# Patient Record
Sex: Female | Born: 1980
Health system: Southern US, Community
[De-identification: ages and names within clinical notes are randomized; demographics above are authoritative.]

## PROBLEM LIST (undated history)

## (undated) DIAGNOSIS — G43909 Migraine, unspecified, not intractable, without status migrainosus: Secondary | ICD-10-CM

## (undated) HISTORY — PX: APPENDECTOMY: SHX54

## (undated) HISTORY — PX: TONSILLECTOMY: SUR1361

## (undated) HISTORY — DX: Migraine, unspecified, not intractable, without status migrainosus: G43.909

## (undated) HISTORY — PX: ELBOW ARTHROSCOPY: SUR87

---

## 2011-07-01 DIAGNOSIS — G43909 Migraine, unspecified, not intractable, without status migrainosus: Secondary | ICD-10-CM | POA: Insufficient documentation

## 2012-06-30 DIAGNOSIS — M79673 Pain in unspecified foot: Secondary | ICD-10-CM | POA: Insufficient documentation

## 2012-06-30 DIAGNOSIS — R609 Edema, unspecified: Secondary | ICD-10-CM | POA: Insufficient documentation

## 2012-06-30 DIAGNOSIS — M79672 Pain in left foot: Secondary | ICD-10-CM | POA: Insufficient documentation

## 2013-10-13 DIAGNOSIS — S42409A Unspecified fracture of lower end of unspecified humerus, initial encounter for closed fracture: Secondary | ICD-10-CM | POA: Insufficient documentation

## 2013-10-13 DIAGNOSIS — G43709 Chronic migraine without aura, not intractable, without status migrainosus: Secondary | ICD-10-CM | POA: Insufficient documentation

## 2013-10-13 DIAGNOSIS — R1030 Lower abdominal pain, unspecified: Secondary | ICD-10-CM | POA: Insufficient documentation

## 2013-10-13 DIAGNOSIS — N3941 Urge incontinence: Secondary | ICD-10-CM | POA: Insufficient documentation

## 2013-10-13 DIAGNOSIS — M545 Low back pain, unspecified: Secondary | ICD-10-CM | POA: Insufficient documentation

## 2013-10-13 DIAGNOSIS — F988 Other specified behavioral and emotional disorders with onset usually occurring in childhood and adolescence: Secondary | ICD-10-CM | POA: Insufficient documentation

## 2013-10-13 DIAGNOSIS — N3010 Interstitial cystitis (chronic) without hematuria: Secondary | ICD-10-CM | POA: Insufficient documentation

## 2013-10-13 DIAGNOSIS — R3911 Hesitancy of micturition: Secondary | ICD-10-CM | POA: Insufficient documentation

## 2013-10-13 DIAGNOSIS — R350 Frequency of micturition: Secondary | ICD-10-CM | POA: Insufficient documentation

## 2015-06-28 DIAGNOSIS — N200 Calculus of kidney: Secondary | ICD-10-CM | POA: Insufficient documentation

## 2015-06-28 DIAGNOSIS — N390 Urinary tract infection, site not specified: Secondary | ICD-10-CM | POA: Insufficient documentation

## 2015-06-28 DIAGNOSIS — R87620 Atypical squamous cells of undetermined significance on cytologic smear of vagina (ASC-US): Secondary | ICD-10-CM | POA: Insufficient documentation

## 2015-06-28 DIAGNOSIS — B001 Herpesviral vesicular dermatitis: Secondary | ICD-10-CM | POA: Insufficient documentation

## 2015-06-28 DIAGNOSIS — E669 Obesity, unspecified: Secondary | ICD-10-CM | POA: Insufficient documentation

## 2015-06-28 DIAGNOSIS — Z975 Presence of (intrauterine) contraceptive device: Secondary | ICD-10-CM | POA: Insufficient documentation

## 2015-06-28 DIAGNOSIS — N926 Irregular menstruation, unspecified: Secondary | ICD-10-CM | POA: Insufficient documentation

## 2015-06-28 DIAGNOSIS — F411 Generalized anxiety disorder: Secondary | ICD-10-CM | POA: Insufficient documentation

## 2015-06-28 DIAGNOSIS — G44219 Episodic tension-type headache, not intractable: Secondary | ICD-10-CM | POA: Insufficient documentation

## 2015-06-28 DIAGNOSIS — G4700 Insomnia, unspecified: Secondary | ICD-10-CM | POA: Insufficient documentation

## 2018-12-08 ENCOUNTER — Other Ambulatory Visit: Payer: Self-pay

## 2018-12-08 ENCOUNTER — Emergency Department (INDEPENDENT_AMBULATORY_CARE_PROVIDER_SITE_OTHER): Payer: No Typology Code available for payment source

## 2018-12-08 ENCOUNTER — Emergency Department
Admission: EM | Admit: 2018-12-08 | Discharge: 2018-12-08 | Disposition: A | Discharge status: Home / Self Care | Payer: No Typology Code available for payment source | Source: Home / Self Care | Attending: Family Medicine | Admitting: Family Medicine

## 2018-12-08 DIAGNOSIS — M5412 Radiculopathy, cervical region: Secondary | ICD-10-CM | POA: Diagnosis not present

## 2018-12-08 DIAGNOSIS — M542 Cervicalgia: Secondary | ICD-10-CM | POA: Diagnosis not present

## 2018-12-08 MED ORDER — PREDNISONE 20 MG PO TABS: ORAL_TABLET | ORAL | 0 refills | Status: DC

## 2018-12-08 MED ORDER — KETOROLAC TROMETHAMINE 60 MG/2ML IM SOLN
60.0000 mg | Freq: Once | INTRAMUSCULAR | Status: AC
  Administered 2018-12-08: 60 mg via INTRAMUSCULAR

## 2018-12-08 MED ORDER — TRAMADOL HCL 50 MG PO TABS: 50.0000 mg | ORAL_TABLET | Freq: Four times a day (QID) | ORAL | 0 refills | Status: DC | PRN

## 2018-12-08 NOTE — Discharge Instructions (Signed)
Apply ice pack for 20 to 30 minutes, 3 to 4 times daily  Continue until pain decreases.  °

## 2018-12-08 NOTE — ED Triage Notes (Signed)
As long as patient can remember whenever moving a patient around, she has neck pain that turns into a migraine.  This time it is more severe than other times.  Intense severe pain behind left ear.

## 2018-12-08 NOTE — ED Provider Notes (Signed)
[facility]    CSN: [id number] Arrival date & time: 12/08/18  1417     History   Chief Complaint Chief Complaint  Patient presents with  . Neck Pain    HPI [facility] is a 38 y.o. female.   Patient complains of approximately four year history of recurring left neck pain and headache, at least 3 to 4 times per week.  The pain often precipitates a migraine headache.  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  Last week her pain became worse and more frequent than in the past.  She has had significant pain behind and beneath her left ear which awakens her at night.  She recalls no recent injury.  The history is provided by the patient.    History reviewed. Past medical history of ADHD and interstitial cystiitis  Active problems:  None   Past Surgical History:  Procedure Laterality Date  . APPENDECTOMY    . ELBOW ARTHROSCOPY    . TONSILLECTOMY      OB History   G2P2      Home Medications    Prior to Admission medications   Medication Sig Start Date End Date Taking? Authorizing Provider  predniSONE (DELTASONE) 20 MG tablet Take one tab by mouth twice daily for 4 days, then one daily. Take with food. 12/08/18   Lattie HawBeese, Arlita Buffkin A, MD  traMADol (ULTRAM) 50 MG tablet Take 1 tablet (50 mg total) by mouth every 6 (six) hours as needed for moderate pain or severe pain. 12/08/18   Lattie HawBeese, Sunya Humbarger A, MD    Family History History reviewed. No pertinent family history.  Social History Social History   Tobacco Use  . Smoking status: Never Smoker  . Smokeless tobacco: Never Used  Substance Use Topics  . Alcohol use: Not Currently  . Drug use: Not Currently     Allergies   Patient has no allergy information on record.   Review of Systems Review of Systems  Constitutional: Negative.   HENT: Negative.   Eyes: Negative.   Respiratory: Negative.   Cardiovascular: Negative.    Gastrointestinal: Negative.   Genitourinary: Negative.   Musculoskeletal: Positive for neck pain and neck stiffness.  Skin: Negative.   Neurological: Positive for headaches. Negative for numbness.     Physical Exam Triage Vital Signs ED Triage Vitals  Enc Vitals Group     BP 12/08/18 1446 126/81     Pulse Rate 12/08/18 1446 75     Resp 12/08/18 1446 20     Temp 12/08/18 1446 98.5 F (36.9 C)     Temp Source 12/08/18 1446 Oral     SpO2 12/08/18 1446 98 %     Weight 12/08/18 1447 198 lb (89.8 kg)     Height 12/08/18 1447 5\' 4"  (1.626 m)     Head Circumference --      Peak Flow --      Pain Score 12/08/18 1446 8     Pain Loc --      Pain Edu? --      Excl. in GC? --    No data found.  Updated Vital Signs BP 126/81 (BP Location: Right Arm)   Pulse 75   Temp 98.5 F (36.9 C) (Oral)   Resp 20   Ht 5\' 4"  (1.626 m)   Wt 89.8 kg   LMP 11/24/2018   SpO2 98%   BMI 33.99 kg/m  Visual Acuity Right Eye Distance:   Left Eye Distance:   Bilateral Distance:    Right Eye Near:   Left Eye Near:    Bilateral Near:     Physical Exam Vitals signs and nursing note reviewed.  Constitutional:      General: She is not in acute distress. HENT:     Head: Normocephalic.     Right Ear: Tympanic membrane, ear canal and external ear normal.     Left Ear: Tympanic membrane, ear canal and external ear normal.     Nose: Nose normal.     Mouth/Throat:     Pharynx: Oropharynx is clear.  Eyes:     Conjunctiva/sclera: Conjunctivae normal.     Pupils: Pupils are equal, round, and reactive to light.  Neck:     Musculoskeletal: Neck supple. Muscular tenderness present.      Comments: Patient has distinct tenderness to palpation left neck and occipital area as noted on diagram.  Her pain extends below this area without tenderness to palpation.   Her pain is exacerbated by rotating and flexing her head to the right. Cardiovascular:     Heart sounds: Normal heart sounds.  Pulmonary:      Breath sounds: Normal breath sounds.  Abdominal:     Tenderness: There is no abdominal tenderness.  Lymphadenopathy:     Cervical: No cervical adenopathy.  Skin:    General: Skin is warm and dry.     Findings: No rash.  Neurological:     Mental Status: She is alert.     Cranial Nerves: Cranial nerves are intact.     Sensory: No sensory deficit.     Motor: Motor function is intact.     Coordination: Romberg sign negative. Coordination normal. Finger-Nose-Finger Test normal.     Deep Tendon Reflexes: Reflexes are normal and symmetric.      UC Treatments / Results  Labs (all labs ordered are listed, but only abnormal results are displayed) Labs Reviewed - No data to display  EKG   Radiology Dg Cervical Spine Complete  Result Date: 12/08/2018 CLINICAL DATA:  Cervicalgia EXAM: CERVICAL SPINE - COMPLETE 4+ VIEW COMPARISON:  None. FINDINGS: Frontal, lateral, open-mouth odontoid, and bilateral oblique views were obtained. There is no fracture or spondylolisthesis. Prevertebral soft tissues and predental space regions are normal. There is moderate disc space narrowing at C6-7. Other disc spaces appear unremarkable. There is exit foraminal narrowing on the left at C6-7 due to bony hypertrophy. No similar changes elsewhere. Lung apices clear. IMPRESSION: Osteoarthritic change at C6-7, primarily on the left. Other disc spaces appear unremarkable. No fracture or spondylolisthesis. Electronically Signed   By: Bretta BangWilliam  Woodruff III M.D.   On: 12/08/2018 15:56    Procedures Procedures (including critical care time)  Medications Ordered in UC Medications  ketorolac (TORADOL) injection 60 mg (60 mg Intramuscular Given 12/08/18 1540)    Initial Impression / Assessment and Plan / UC Course  I have reviewed the triage vital signs and the nursing notes.  Pertinent labs & imaging results that were available during my care of the patient were reviewed by me and considered in my medical decision  making (see chart for details).    Administered Toradol 60mg  IM with mild improvement in pain. C-spine X-ray definitely shows foraminal narrowing on the left at C6-C7 where patient experiences pain, but she also has distinct pain and tenderness at higher levels on the left. Begin prednisone burst/taper.  Rx for Tramadol (#15, no refill). Followup  with Dr. Lynne Leader ([facility]) for further evaluation.  Final Clinical Impressions(s) / UC Diagnoses   Final diagnoses:  Cervical radiculopathy     Discharge Instructions     Apply ice pack for 20 to 30 minutes, 3 to 4 times daily  Continue until pain decreases    ED Prescriptions    Medication Sig Dispense Auth. Provider   predniSONE (DELTASONE) 20 MG tablet Take one tab by mouth twice daily for 4 days, then one daily. Take with food. 12 tablet Kandra Nicolas, MD   traMADol (ULTRAM) 50 MG tablet Take 1 tablet (50 mg total) by mouth every 6 (six) hours as needed for moderate pain or severe pain. 15 tablet Kandra Nicolas, MD        Kandra Nicolas, MD 12/09/18 1213

## 2018-12-10 ENCOUNTER — Ambulatory Visit (INDEPENDENT_AMBULATORY_CARE_PROVIDER_SITE_OTHER): Payer: No Typology Code available for payment source | Admitting: Family Medicine

## 2018-12-10 ENCOUNTER — Encounter: Payer: Self-pay | Admitting: Family Medicine

## 2018-12-10 ENCOUNTER — Other Ambulatory Visit: Payer: Self-pay

## 2018-12-10 VITALS — BP 119/76 | HR 72 | Temp 98.2°F | Ht 65.0 in | Wt 194.9 lb

## 2018-12-10 DIAGNOSIS — M62838 Other muscle spasm: Secondary | ICD-10-CM | POA: Diagnosis not present

## 2018-12-10 MED ORDER — TIZANIDINE HCL 4 MG PO CAPS: 4.0000 mg | ORAL_CAPSULE | Freq: Three times a day (TID) | ORAL | 3 refills | Status: DC | PRN

## 2018-12-10 NOTE — Progress Notes (Signed)
Erin [facility] is a 38 y.o. female who presents to [place] today for left sided neck pain.  Neck Pain: Patient reports a several year Hx of neck pain on the left side. Says it has been getting worse in the last year. Describes severe pain over the left neck just inferior to the mastoid process. Sometimes has pain moving down left arm. Denies numbness, weakness, or paresthesias She also have accompanying tension headaches. Patient visited Urgent Care on 12/08/2018 for a severe flare up of this pain and was prescribed Prednisone and Tramadol, which has been helping but not relieving the pain. Pain is improved with Tizanidine, Excedrin, NSAIDs, but she not taken Tizanidine in a while. 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 She denies any current pain radiating down her arm.  She has had some pain similar to that in the past but none with this current episode.  She works as a Archivist requiring lots of lifting.   ROS:  As above  Exam:  BP 119/76   Pulse 72   Temp 98.2 F (36.8 C) (Oral)   Ht 5\' 5"  (1.651 m)   Wt 194 lb 14.4 oz (88.4 kg)   LMP 11/24/2018   BMI 32.43 kg/m  Wt Readings from Last 5 Encounters:  12/10/18 194 lb 14.4 oz (88.4 kg)  12/08/18 198 lb (89.8 kg)   General: Well Developed, well nourished, and in no acute distress.  Neuro/Psych: Alert and oriented x3, extra-ocular muscles intact, able to move all 4 extremities, sensation grossly intact. Skin: Warm and dry, no rashes noted.  Respiratory: Not using accessory muscles, speaking in full sentences, trachea midline.  Cardiovascular: Pulses palpable, no extremity edema. Abdomen: Does not appear distended. MSK: CSpine:  Normal Appearing. Tender to palpation over left lateral neck inferior to mastoid process. Stiffness and pain with neck forward flexion and extension, lateral flexion. Marland Kitchen  Upper extremity strength reflexes and sensation  are intact and equal throughout.  Negative Spurling's test.  Left Arm: Normal appearing. Non-tender to palpation. Normal strength BL. Normal grip strength. Reflexes 2+ and symmetrical.    Lab and Radiology Results No results found for this or any previous visit (from the past 72 hour(s)). Dg Cervical Spine Complete  Result Date: 12/08/2018 CLINICAL DATA:  Cervicalgia EXAM: CERVICAL SPINE - COMPLETE 4+ VIEW COMPARISON:  None. FINDINGS: Frontal, lateral, open-mouth odontoid, and bilateral oblique views were obtained. There is no fracture or spondylolisthesis. Prevertebral soft tissues and predental space regions are normal. There is moderate disc space narrowing at C6-7. Other disc spaces appear unremarkable. There is exit foraminal narrowing on the left at C6-7 due to bony hypertrophy. No similar changes elsewhere. Lung apices clear. IMPRESSION: Osteoarthritic change at C6-7, primarily on the left. Other disc spaces appear unremarkable. No fracture or spondylolisthesis. Electronically Signed   By: Lowella Grip III M.D.   On: 12/08/2018 15:56   I personally (independently) visualized and performed the interpretation of the images attached in this note.     Assessment and Plan: 38 y.o. female with several year Hx of neck pain on the left side that has been worsening.  Neck Pain: Describes severe pain over the left neck just inferior to the mastoid process. Sometimes has pain moving down left arm. Denies numbness, weakness, or paresthesias She also have accompanying tension headaches. Patient visited Urgent Care on 12/08/2018 for a severe flare up of this pain and was prescribed Prednisone  and Tramadol, which has been helping but not relieving the pain. Pain is improved with Tizanidine, Excedrin, NSAIDs. 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 X-ray showed mild cervical spine OA and straightening of cervical vertebral curve. Location of pain on upper  neck and lack of consistent symptoms radiating down arm suggests MSK etiology such as muscle overuse and spasms instead of nerve impingement.   Refered to PT. Recommended use of heating pad and TENS unit. Prescribed Tizanidine.  Follow-up if not improving in 4 weeks.  Come back or go to the emergency room if you notice new weakness new numbness, problems walking, or bowel or bladder incontinence.   PDMP not reviewed this encounter. Orders Placed This Encounter  Procedures  . Ambulatory referral to Physical Therapy    Referral Priority:   Routine    Referral Type:   Physical Medicine    Referral Reason:   Specialty Services Required    Requested Specialty:   Physical Therapy   Meds ordered this encounter  Medications  . tiZANidine (ZANAFLEX) 4 MG capsule    Sig: Take 1 capsule (4 mg total) by mouth 3 (three) times daily as needed for muscle spasms.    Dispense:  90 capsule    Refill:  3    Historical information moved to improve visibility of documentation.  No past medical history on file. Past Surgical History:  Procedure Laterality Date  . APPENDECTOMY    . ELBOW ARTHROSCOPY    . TONSILLECTOMY     Social History   Tobacco Use  . Smoking status: Never Smoker  . Smokeless tobacco: Never Used  Substance Use Topics  . Alcohol use: Not Currently   family history is not on file.  Medications: Current Outpatient Medications  Medication Sig Dispense Refill  . predniSONE (DELTASONE) 20 MG tablet Take one tab by mouth twice daily for 4 days, then one daily. Take with food. 12 tablet 0  . traMADol (ULTRAM) 50 MG tablet Take 1 tablet (50 mg total) by mouth every 6 (six) hours as needed for moderate pain or severe pain. 15 tablet 0  . tiZANidine (ZANAFLEX) 4 MG capsule Take 1 capsule (4 mg total) by mouth 3 (three) times daily as needed for muscle spasms. 90 capsule 3   No current facility-administered medications for this visit.    Allergies  Allergen Reactions  . Latex  Itching and Rash    Other reaction(s): Flushing (ALLERGY/intolerance)  . Sulfamethoxazole-Trimethoprim Hives and Itching  . Cephalexin Nausea And Vomiting, Itching and Rash    Hives/vomiting   . Nitrofurantoin Nausea And Vomiting, Itching and Rash    Hives/vomiting       Discussed warning signs or symptoms. Please see discharge instructions. Patient expresses understanding.  I personally was present and performed or re-performed the history, physical exam and medical decision-making activities of this service and have verified that the service and findings are accurately documented in the student's note. ___________________________________________ Clementeen GrahamEvan Ryder Chesmore M.D., ABFM., CAQSM. Primary Care and Sports Medicine Adjunct Instructor of Family Medicine  [employer]

## 2018-12-10 NOTE — Patient Instructions (Addendum)
Thank you for coming in today.  Use a heating pad and TENS unit.  Attend PT.  Recheck if not improving a lot in 4 weeks.  Come back or go to the emergency room if you notice new weakness new numbness problems walking or bowel or bladder problems.  TENS UNIT: This is helpful for muscle pain and spasm.   Search and Purchase a TENS 7000 2nd edition at  www.tenspros.com or www.[email or web address].com It should be less than $30.     TENS unit instructions: Do not shower or bathe with the unit on Turn the unit off before removing electrodes or batteries If the electrodes lose stickiness add a drop of water to the electrodes after they are disconnected from the unit and place on plastic sheet. If you continued to have difficulty, call the TENS unit company to purchase more electrodes. Do not apply lotion on the skin area prior to use. Make sure the skin is clean and dry as this will help prolong the life of the electrodes. After use, always check skin for unusual red areas, rash or other skin difficulties. If there are any skin problems, does not apply electrodes to the same area. Never remove the electrodes from the unit by pulling the wires. Do not use the TENS unit or electrodes other than as directed. Do not change electrode placement without consultating your therapist or physician. Keep 2 fingers with between each electrode. Wear time ratio is 2:1, on to off times.    For example on for 30 minutes off for 15 minutes and then on for 30 minutes off for 15 minutes

## 2019-01-11 ENCOUNTER — Ambulatory Visit (INDEPENDENT_AMBULATORY_CARE_PROVIDER_SITE_OTHER): Payer: No Typology Code available for payment source | Admitting: Rehabilitative and Restorative Service Providers"

## 2019-01-11 ENCOUNTER — Other Ambulatory Visit: Payer: Self-pay

## 2019-01-11 DIAGNOSIS — R293 Abnormal posture: Secondary | ICD-10-CM

## 2019-01-11 DIAGNOSIS — M542 Cervicalgia: Secondary | ICD-10-CM

## 2019-01-11 DIAGNOSIS — R29898 Other symptoms and signs involving the musculoskeletal system: Secondary | ICD-10-CM | POA: Diagnosis not present

## 2019-01-11 NOTE — Therapy (Signed)
[facility] [place], [place], [address] Phone: [phone]   Fax:  [phone]  Physical Therapy Evaluation  Patient Details  Name: [facility] MRN: [id number] Date of Birth: May 21, 1980 Referring Provider (PT): Dr Lynne Leader    Encounter Date: 01/11/2019  PT End of Session - 01/11/19 1008    Visit Number  1    Number of Visits  12    Date for PT Re-Evaluation  02/22/19    PT Start Time  0821    PT Stop Time  0903    PT Time Calculation (min)  42 min    Activity Tolerance  Patient tolerated treatment well       No past medical history on file.  Past Surgical History:  Procedure Laterality Date  . APPENDECTOMY    . ELBOW ARTHROSCOPY    . TONSILLECTOMY      There were no vitals filed for this visit.   Subjective Assessment - 01/11/19 1324    Subjective  Patient reports neck pain in the past 8-10 years with pain increased in the past 3-4 weeks. She has episodic pain related to lifting and wakr tasks    Pertinent History  elbow surgery; arthritis    Patient Stated Goals  get rid of pain    Currently in Pain?  Yes    Pain Score  7     Pain Location  Neck    Pain Orientation  Left    Pain Descriptors / Indicators  Aching;Dull;Throbbing    Pain Type  Acute pain;Chronic pain    Pain Radiating Towards  radiating down arm to forearms and hand - a warm sensation    Pain Onset  More than a month ago    Pain Frequency  Constant    Aggravating Factors   lifting; pushing; pulling; use of Lt UE    Pain Relieving Factors  medicine on a temporary basis         [facility] PT Assessment - 01/11/19 0001      Assessment   Medical Diagnosis  Cervicalgia    Referring Provider (PT)  Dr Lynne Leader     Onset Date/Surgical Date  12/07/18    Hand Dominance  Right    Next MD Visit  none scheduled     Prior Therapy  none per pt report       Precautions   Precautions  None      Balance Screen   Has the patient  fallen in the past 6 months  No    Has the patient had a decrease in activity level because of a fear of falling?   No    Is the patient reluctant to leave their home because of a fear of falling?   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     Leisure  2 children at home single mom; household chores; child care       Observation/Other Assessments   Focus on Therapeutic Outcomes (FOTO)   50% limitation       Sensation   Additional Comments  warm sensation into the dorsal forearm and hand on an intermittent basis       Posture/Postural Control   Posture Comments  head forward; shoudlers rounded and elevated; head of  the humerus anterior in orientation; scapulae abducted and rotated along the thoracic spine       AROM   AROM Assessment Site  --   pain with all cervical motions > flexion; Lt lat flex   Cervical Flexion  47    Cervical Extension  33    Cervical - Right Side Bend  36    Cervical - Left Side Bend  23    Cervical - Right Rotation  63    Cervical - Left Rotation  70      Strength   Overall Strength Comments  WFL's        Palpation   Palpation comment  muscular tightness Lt upper quarter - ant/lat/post cervical musculature; pecs; upper trap; leveator; teres                 Objective measurements completed on examination: See above findings.      [facility] Adult PT Treatment/Exercise - 01/11/19 0001      Neuro Re-ed    Neuro Re-ed Details   initiated postural correction/education       Shoulder Exercises: Standing   Other Standing Exercises  axial extension 10 sec x 10; scap squeeze 10 sec x 5; L's x 5 with noodle       Moist Heat Therapy   Number Minutes Moist Heat  15 Minutes    Moist Heat Location  Cervical;Shoulder   Lt shoulder      Electrical Stimulation   Electrical Stimulation Location  Lt cervical to Lt upper  trap/pecs     Electrical Stimulation Action  IFC    Electrical Stimulation Parameters  to tolerance    Electrical Stimulation Goals  Pain;Tone             PT Education - 01/11/19 1006    Education Details  HEP; postural correction; musculoskeletal pain; TENS; POC    Person(s) Educated  Patient    Methods  Explanation;Demonstration;Tactile cues;Verbal cues;Handout    Comprehension  Verbalized understanding;Returned demonstration;Verbal cues required;Tactile cues required          PT Long Term Goals - 01/11/19 1014      PT LONG TERM GOAL #1   Title  Decrease cervical and UE pain/symptoms by 50-70% allowing patient to progress with stretching, strengthening, stabilization program and participate in work and home activities with greater ease    Time  6    Period  Weeks    Status  New    Target Date  02/22/19      PT LONG TERM GOAL #2   Title  Increase cervical ROM to WFL's and pain free in all planes    Time  6    Period  Weeks    Status  New    Target Date  02/22/19      PT LONG TERM GOAL #3   Title  Improve cervical and thoacic posture and alignment with patient to demonstrate improved upright posture with head in good alignment and posterior shoulder girdle engaged    Time  6    Period  Weeks    Status  New    Target Date  02/22/19      PT LONG TERM GOAL #4   Title  Independent in HEP    Time  6    Period  Weeks    Status  New    Target Date  02/22/19      PT LONG TERM GOAL #5   Title  Improve  FOTO to </= 33% limitation    Time  6    Period  Weeks    Status  New    Target Date  02/22/19             Plan - 01/11/19 1009    Clinical Impression Statement  Patient presents with recent flare up of chronic cervical dysfunction. She has poor posture and alignment; limited cervical and shoulder ROM; significant muscular tightness and imbalance through the cervical and shoulder girdle musculature; pain on a constant basis; limited functional and work  activity tolerance. Patient will benefit form PT to address problems identified.    Stability/Clinical Decision Making  Stable/Uncomplicated    Clinical Decision Making  Low    Rehab Potential  Good    PT Frequency  2x / week    PT Duration  6 weeks    PT Treatment/Interventions  Patient/family education;ADLs/Self Care Home Management;Cryotherapy;Electrical Stimulation;Iontophoresis 4mg /ml Dexamethasone;Moist Heat;Ultrasound;Manual techniques;Dry needling;Traction;Neuromuscular re-education;Therapeutic activities;Therapeutic exercise    PT Next Visit Plan  review HEP; add pec stretch; work on posture and alignment stretching pecs and strengthening posterior shoulder girdle as symptoms allow; add manual work focus on anterior structures; possible trial of DN; modalities as indicated    PT Home Exercise Plan  [email or web address]    Consulted and Agree with Plan of Care  Patient       Patient will benefit from skilled therapeutic intervention in order to improve the following deficits and impairments:  Pain, Increased fascial restricitons, Increased muscle spasms, Hypomobility, Postural dysfunction, Improper body mechanics, Decreased mobility, Decreased range of motion, Decreased activity tolerance  Visit Diagnosis: Cervicalgia - Plan: PT plan of care cert/re-cert  Abnormal posture - Plan: PT plan of care cert/re-cert  Other symptoms and signs involving the musculoskeletal system - Plan: PT plan of care cert/re-cert     Problem List There are no active problems to display for this patient.   Ethridge Sollenberger Rober Minion PT, MPH  01/11/2019, 1:01 PM  [facility] [address] [place], [place], [address] Phone: [phone]   Fax:  [phone]  Name: Erin [facility] MRN: [id number] Date of Birth: March 31, 1981

## 2019-01-11 NOTE — Patient Instructions (Signed)
Access Code: [email or web address]  URL: [email or web address]  Date: 01/11/2019  Prepared by: Gillermo Murdoch   Exercises  Seated Cervical Retraction - 10 reps - 1 sets - 3x daily - 7x weekly  Standing Scapular Retraction - 10 reps - 1 sets - 10 hold - 3x daily - 7x weekly  Shoulder External Rotation and Scapular Retraction - 10 reps - 1 sets - hold - 3x daily - 7x weekly  Patient Education  TENS Unit

## 2019-01-15 ENCOUNTER — Other Ambulatory Visit: Payer: Self-pay

## 2019-01-15 ENCOUNTER — Ambulatory Visit (INDEPENDENT_AMBULATORY_CARE_PROVIDER_SITE_OTHER): Payer: No Typology Code available for payment source | Admitting: Rehabilitative and Restorative Service Providers"

## 2019-01-15 ENCOUNTER — Encounter: Payer: Self-pay | Admitting: Rehabilitative and Restorative Service Providers"

## 2019-01-15 DIAGNOSIS — R293 Abnormal posture: Secondary | ICD-10-CM

## 2019-01-15 DIAGNOSIS — M542 Cervicalgia: Secondary | ICD-10-CM | POA: Diagnosis not present

## 2019-01-15 DIAGNOSIS — R29898 Other symptoms and signs involving the musculoskeletal system: Secondary | ICD-10-CM

## 2019-01-15 NOTE — Patient Instructions (Addendum)
Access Code: [email or web address]  URL: [email or web address]  Date: 01/15/2019  Prepared by: Gillermo Murdoch   Exercises  Seated Cervical Retraction - 10 reps - 1 sets - 3x daily - 7x weekly  Standing Scapular Retraction - 10 reps - 1 sets - 10 hold - 3x daily - 7x weekly  Shoulder External Rotation and Scapular Retraction - 10 reps - 1 sets - hold - 3x daily - 7x weekly  Doorway Pec Stretch at 60 Degrees Abduction - 3 reps - 1 sets - 3x daily - 7x weekly  Doorway Pec Stretch at 90 Degrees Abduction - 3 reps - 1 sets - 30 seconds hold - 3x daily - 7x weekly  Doorway Pec Stretch at 120 Degrees Abduction - 3 reps - 1 sets - 30 second hold hold - 3x daily - 7x weekly  Supine Chin Tuck - 5 reps - 1 sets - 10 sec hold - 2x daily - 7x weekly    Trigger Point Dry Needling  . What is Trigger Point Dry Needling (DN)? o DN is a physical therapy technique used to treat muscle pain and dysfunction. Specifically, DN helps deactivate muscle trigger points (muscle knots).  o A thin filiform needle is used to penetrate the skin and stimulate the underlying trigger point. The goal is for a local twitch response (LTR) to occur and for the trigger point to relax. No medication of any kind is injected during the procedure.   . What Does Trigger Point Dry Needling Feel Like?  o The procedure feels different for each individual patient. Some patients report that they do not actually feel the needle enter the skin and overall the process is not painful. Very mild bleeding may occur. However, many patients feel a deep cramping in the muscle in which the needle was inserted. This is the local twitch response.   Marland Kitchen How Will I feel after the treatment? o Soreness is normal, and the onset of soreness may not occur for a few hours. Typically this soreness does not last longer than two days.  o Bruising is uncommon, however; ice can be used to decrease any possible bruising.  o In rare cases feeling tired or nauseous  after the treatment is normal. In addition, your symptoms may get worse before they get better, this period will typically not last longer than 24 hours.   . What Can I do After My Treatment? o Increase your hydration by drinking more water for the next 24 hours. o You may place ice or heat on the areas treated that have become sore, however, do not use heat on inflamed or bruised areas. Heat often brings more relief post needling. o You can continue your regular activities, but vigorous activity is not recommended initially after the treatment for 24 hours. o DN is best combined with other physical therapy such as strengthening, stretching, and other therapies.

## 2019-01-15 NOTE — Therapy (Addendum)
[place] [address] [place], [place], [address] Phone: [phone]   Fax:  [phone]  Physical Therapy Treatment  Patient Details  Name: Erin Little MRN: [id number] Date of Birth: 1980-08-13 Referring Provider (PT): Dr Lynne Leader    Encounter Date: 01/15/2019  PT End of Session - 01/15/19 1107    Visit Number  2    Number of Visits  12    Date for PT Re-Evaluation  02/22/19    PT Start Time  1106    PT Stop Time  1148    PT Time Calculation (min)  42 min    Activity Tolerance  Patient tolerated treatment well       History reviewed. No pertinent past medical history.  Past Surgical History:  Procedure Laterality Date  . APPENDECTOMY    . ELBOW ARTHROSCOPY    . TONSILLECTOMY      There were no vitals filed for this visit.  Subjective Assessment - 01/15/19 1107    Subjective  Patient reports continued pain especially in the neck. 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 Reports that she has not done her exercises at home. She has not gotten the pool noodle out of the garage. Has not ordered the TENS unit. Waiting until she gets pain.    Currently in Pain?  Yes    Pain Score  8     Pain Location  Neck    Pain Orientation  Left    Pain Descriptors / Indicators  Throbbing;Aching;Dull    Pain Type  Acute pain;Chronic pain                       [facility] Adult PT Treatment/Exercise - 01/15/19 0001      Self-Care   Self-Care  --   transition supine to sidelying to sit and reverse      Neuro Re-ed    Neuro Re-ed Details   working on postural correction/education       Neck Exercises: Supine   Neck Retraction  5 reps;10 secs    Capital Flexion Limitations  nodding yes x 10     Cervical Rotation  Right;Left;10 reps   head resting on pillow      Shoulder Exercises: Standing   Other Standing Exercises  axial extension 10 sec x 10; scap squeeze 10 sec x 5; L's x 5 with noodle       Shoulder Exercises: Stretch   Other Shoulder Stretches  doorway stretch 3 positions 30 sec hold x 2 reps       Manual Therapy   Manual therapy comments  pt supine     Joint Mobilization  gentle cervical mobs PA mobs     Soft tissue mobilization  deep tissue work to pt tolerance     Myofascial Release  Lt pecs and upper shoulder              PT Education - 01/15/19 1150    Education Details  HEP postural correctin; transitional movements supine to sidelying to sit and reverse; DN    Person(s) Educated  Patient    Methods  Explanation;Demonstration;Tactile cues;Verbal cues;Handout    Comprehension  Verbalized understanding;Returned demonstration;Verbal cues required;Tactile cues required          PT Long Term Goals - 01/11/19 1014      PT LONG TERM GOAL #1   Title  Decrease cervical and UE pain/symptoms by 50-70% allowing patient to  progress with stretching, strengthening, stabilization program and participate in work and home activities with greater ease    Time  6    Period  Weeks    Status  New    Target Date  02/22/19      PT LONG TERM GOAL #2   Title  Increase cervical ROM to WFL's and pain free in all planes    Time  6    Period  Weeks    Status  New    Target Date  02/22/19      PT LONG TERM GOAL #3   Title  Improve cervical and thoacic posture and alignment with patient to demonstrate improved upright posture with head in good alignment and posterior shoulder girdle engaged    Time  6    Period  Weeks    Status  New    Target Date  02/22/19      PT LONG TERM GOAL #4   Title  Independent in HEP    Time  6    Period  Weeks    Status  New    Target Date  02/22/19      PT LONG TERM GOAL #5   Title  Improve FOTO to </= 33% limitation    Time  6    Period  Weeks    Status  New    Target Date  02/22/19            Plan - 01/15/19 1107    Clinical Impression Statement  Patient reports continued pain with no change in intensity. She has not  worked on [employer]. Patient has exquisite tenderness to palpation in the Lt lateral cervical musculature toward jaw. Tight in SCM, ant/lat/posterior cervical musculature into pecs and upper trap - Lt upper quarter. may benefit from DN.    Stability/Clinical Decision Making  Stable/Uncomplicated    Rehab Potential  Good    PT Frequency  2x / week    PT Duration  6 weeks    PT Treatment/Interventions  Patient/family education;ADLs/Self Care Home Management;Cryotherapy;Electrical Stimulation;Iontophoresis 4mg /ml Dexamethasone;Moist Heat;Ultrasound;Manual techniques;Dry needling;Traction;Neuromuscular re-education;Therapeutic activities;Therapeutic exercise    PT Next Visit Plan  review HEP; work on posture and alignment stretching pecs and strengthening posterior shoulder girdle as symptoms allow; assess response to manual work focus on anterior structures; possible trial of DN; modalities as indicated    PT Home Exercise Plan  [email or web address]    Consulted and Agree with Plan of Care  Patient       Patient will benefit from skilled therapeutic intervention in order to improve the following deficits and impairments:  Pain, Increased fascial restricitons, Increased muscle spasms, Hypomobility, Postural dysfunction, Improper body mechanics, Decreased mobility, Decreased range of motion, Decreased activity tolerance  Visit Diagnosis: Cervicalgia  Abnormal posture  Other symptoms and signs involving the musculoskeletal system     Problem List There are no active problems to display for this patient.   Zamir Staples Rober MinionP Alayiah Fontes PT, MPH  01/15/2019, 12:26 PM  [facility] [address] [place], [place], [address] Phone: [phone]   Fax:  [phone]  Name: Erin RoesHelen Little MRN: [id number] Date of Birth: 04/13/1981

## 2019-01-21 ENCOUNTER — Encounter: Payer: Self-pay | Admitting: Rehabilitative and Restorative Service Providers"

## 2019-01-21 ENCOUNTER — Ambulatory Visit (INDEPENDENT_AMBULATORY_CARE_PROVIDER_SITE_OTHER): Payer: No Typology Code available for payment source | Admitting: Rehabilitative and Restorative Service Providers"

## 2019-01-21 ENCOUNTER — Other Ambulatory Visit: Payer: Self-pay

## 2019-01-21 DIAGNOSIS — R29898 Other symptoms and signs involving the musculoskeletal system: Secondary | ICD-10-CM

## 2019-01-21 DIAGNOSIS — R293 Abnormal posture: Secondary | ICD-10-CM | POA: Diagnosis not present

## 2019-01-21 DIAGNOSIS — M542 Cervicalgia: Secondary | ICD-10-CM | POA: Diagnosis not present

## 2019-01-21 NOTE — Patient Instructions (Signed)
Access Code: [email or web address]  URL: [email or web address]  Date: 01/21/2019  Prepared by: Gillermo Murdoch   Exercises  Seated Cervical Retraction - 10 reps - 1 sets - 3x daily - 7x weekly  Standing Scapular Retraction - 10 reps - 1 sets - 10 hold - 3x daily - 7x weekly  Shoulder External Rotation and Scapular Retraction - 10 reps - 1 sets - hold - 3x daily - 7x weekly  Doorway Pec Stretch at 60 Degrees Abduction - 3 reps - 1 sets - 3x daily - 7x weekly  Doorway Pec Stretch at 90 Degrees Abduction - 3 reps - 1 sets - 30 seconds hold - 3x daily - 7x weekly  Doorway Pec Stretch at 120 Degrees Abduction - 3 reps - 1 sets - 30 second hold hold - 3x daily - 7x weekly  Thoracic Extension Mobilization with Noodle - 1-2 reps - 1 sets - 1-2 min hold - 2x daily - 7x weekly  Supine Chin Tuck - 5 reps - 1 sets - 10 sec hold - 2x daily - 7x weekly  Neck Sidebending - 5 reps - 1 sets - 10 sec hold - 2x daily - 7x weekly  Seated Shoulder Rolls - 10 reps - 1 sets - 2x daily - 7x weekly  Standing Shoulder Shrugs with Dumbbells - 10 reps - 1 sets - 2x daily - 7x weekly

## 2019-01-21 NOTE — Therapy (Signed)
[place], [place], [address] Phone: [phone]   Fax:  [phone]  Physical Therapy Treatment  Patient Details  Name: Erin Little MRN: [id number] Date of Birth: 1981-03-12 Referring Provider (PT): Dr Lynne Leader    Encounter Date: 01/21/2019  PT End of Session - 01/21/19 1154    Visit Number  3    Number of Visits  12    Date for PT Re-Evaluation  02/22/19    PT Start Time  1152    PT Stop Time  1238    PT Time Calculation (min)  46 min    Activity Tolerance  Patient tolerated treatment well       History reviewed. No pertinent past medical history.  Past Surgical History:  Procedure Laterality Date  . APPENDECTOMY    . ELBOW ARTHROSCOPY    . TONSILLECTOMY      There were no vitals filed for this visit.  Subjective Assessment - 01/21/19 1155    Subjective  Patient reports that "things have loosened up a little bit, slightly"  She is working on her exercises at home    Currently in Pain?  Yes    Pain Score  6     Pain Location  Neck    Pain Orientation  Left    Pain Descriptors / Indicators  Aching;Dull    Pain Type  Acute pain;Chronic pain         [facility] PT Assessment - 01/21/19 0001      Assessment   Medical Diagnosis  Cervicalgia    Referring Provider (PT)  Dr Lynne Leader     Onset Date/Surgical Date  12/07/18    Hand Dominance  Right    Next MD Visit  none scheduled     Prior Therapy  none per pt report       Palpation   Palpation comment  continued muscular tightness Lt upper quarter - ant/lat/post cervical musculature; pecs; upper trap; leveator; teres                    [facility] Adult PT Treatment/Exercise - 01/21/19 0001      Neck Exercises: Seated   Neck Retraction  5 reps;10 secs    Lateral Flexion  Right;Left;5 reps      Neck Exercises: Supine   Neck Retraction  5 reps;10 secs    Capital Flexion Limitations  nodding yes x 10     Cervical  Rotation  Right;Left;10 reps   head resting on pillow      Shoulder Exercises: Seated   Other Seated Exercises  shoulder rolls backward x 10; shoulder elevation and depression x 10;       Shoulder Exercises: Standing   Other Standing Exercises  axial extension 10 sec x 10; scap squeeze 10 sec x 5; L's x 5 with noodle       Shoulder Exercises: Stretch   Other Shoulder Stretches  doorway stretch 3 positions 30 sec hold x 3 reps     Other Shoulder Stretches  prolonged snow angel shoulders at ~ 90 deg abduction ~ 2-3 min bending elbow to release stretch as needed       Moist Heat Therapy   Number Minutes Moist Heat  15 Minutes    Moist Heat Location  Cervical;Shoulder      Electrical Stimulation   Electrical Stimulation Location  Lt cervical to Lt upper trap/pecs     Printmaker Action  IRC  Electrical Stimulation Parameters  to tolerance     Electrical Stimulation Goals  Pain;Tone      Manual Therapy   Manual therapy comments  pt supine     Joint Mobilization  cervical and thoracic lateral and mobs PA mobs     Soft tissue mobilization  deep tissue work to pt tolerance     Myofascial Release  anterior chest/pecs     Manual Traction  through long arm Lt UE - arm in neutral at side              PT Education - 01/21/19 1209    Education Details  HEP    Person(s) Educated  Patient    Methods  Explanation;Demonstration;Tactile cues;Verbal cues;Handout    Comprehension  Verbalized understanding;Returned demonstration;Verbal cues required;Tactile cues required          PT Long Term Goals - 01/11/19 1014      PT LONG TERM GOAL #1   Title  Decrease cervical and UE pain/symptoms by 50-70% allowing patient to progress with stretching, strengthening, stabilization program and participate in work and home activities with greater ease    Time  6    Period  Weeks    Status  New    Target Date  02/22/19      PT LONG TERM GOAL #2   Title  Increase cervical ROM to  WFL's and pain free in all planes    Time  6    Period  Weeks    Status  New    Target Date  02/22/19      PT LONG TERM GOAL #3   Title  Improve cervical and thoacic posture and alignment with patient to demonstrate improved upright posture with head in good alignment and posterior shoulder girdle engaged    Time  6    Period  Weeks    Status  New    Target Date  02/22/19      PT LONG TERM GOAL #4   Title  Independent in HEP    Time  6    Period  Weeks    Status  New    Target Date  02/22/19      PT LONG TERM GOAL #5   Title  Improve FOTO to </= 33% limitation    Time  6    Period  Weeks    Status  New    Target Date  02/22/19            Plan - 01/21/19 1155    Clinical Impression Statement  Patient verbalizes improvement with decreased intensity of pain in the Lt cervical area. She states that she is now rolling to come to sit and being more mindful of how she moves. Improved demonstration of HEP today - requiring fewer instructions for correct technique with exercises. Patient demonstrates improving posture and alignment. Palpable tightness is decreased but persists in the cervical and shoulder girdle areas. patient will benefit from continued treatment to address myofacial pain and dysfunction. May benefit from trial of dry needling.    Stability/Clinical Decision Making  Stable/Uncomplicated    Rehab Potential  Good    PT Frequency  2x / week    PT Duration  6 weeks    PT Treatment/Interventions  Patient/family education;ADLs/Self Care Home Management;Cryotherapy;Electrical Stimulation;Iontophoresis 4mg /ml Dexamethasone;Moist Heat;Ultrasound;Manual techniques;Dry needling;Traction;Neuromuscular re-education;Therapeutic activities;Therapeutic exercise    PT Next Visit Plan  review HEP; work on posture and alignment stretching pecs and strengthening posterior shoulder girdle  as symptoms allow; continue with manual work focus on anterior structures; possible trial of DN;  modalities as indicated    PT Home Exercise Plan  [email or web address]    Consulted and Agree with Plan of Care  Patient       Patient will benefit from skilled therapeutic intervention in order to improve the following deficits and impairments:  Pain, Increased fascial restricitons, Increased muscle spasms, Hypomobility, Postural dysfunction, Improper body mechanics, Decreased mobility, Decreased range of motion, Decreased activity tolerance  Visit Diagnosis: Cervicalgia  Abnormal posture  Other symptoms and signs involving the musculoskeletal system     Problem List There are no active problems to display for this patient.   Verne Cove Rober MinionP Justun Anaya PT, MPH  01/21/2019, 1:24 PM  [facility] [address] [place], [place], [address] Phone: [phone]   Fax:  [phone]  Name: Erin RoesHelen Little MRN: [id number] Date of Birth: 10/21/1980

## 2019-01-22 ENCOUNTER — Encounter: Payer: No Typology Code available for payment source | Admitting: Physical Therapy

## 2019-01-25 ENCOUNTER — Encounter: Payer: No Typology Code available for payment source | Admitting: Physical Therapy

## 2019-01-28 ENCOUNTER — Ambulatory Visit (INDEPENDENT_AMBULATORY_CARE_PROVIDER_SITE_OTHER): Payer: No Typology Code available for payment source | Admitting: Physical Therapy

## 2019-01-28 ENCOUNTER — Other Ambulatory Visit: Payer: Self-pay

## 2019-01-28 DIAGNOSIS — R293 Abnormal posture: Secondary | ICD-10-CM

## 2019-01-28 DIAGNOSIS — M542 Cervicalgia: Secondary | ICD-10-CM | POA: Diagnosis not present

## 2019-01-28 DIAGNOSIS — R29898 Other symptoms and signs involving the musculoskeletal system: Secondary | ICD-10-CM | POA: Diagnosis not present

## 2019-01-28 NOTE — Therapy (Signed)
[place]  [place], [place], [address] Phone: [phone]   Fax:  [phone]  Physical Therapy Treatment  Patient Details  Name: Erin Little MRN: [id number] Date of Birth: 05-Oct-1980 Referring Provider (PT): Dr Lynne Leader    Encounter Date: 01/28/2019  PT End of Session - 01/28/19 1205    Visit Number  4    Number of Visits  12    Date for PT Re-Evaluation  02/22/19    PT Start Time  0867   pt arrived late   PT Stop Time  1245    PT Time Calculation (min)  50 min    Activity Tolerance  Patient limited by pain    Behavior During Therapy  [facility] for tasks assessed/performed       No past medical history on file.  Past Surgical History:  Procedure Laterality Date  . APPENDECTOMY    . ELBOW ARTHROSCOPY    . TONSILLECTOMY      There were no vitals filed for this visit.  Subjective Assessment - 01/28/19 1206    Subjective  Pt reports she moved a pt yesterday and has had increased pain in Rt shoulder/neck/head.  overall, she feels some improvement and is not taking as much medicine.  Her TENS unit should arrive Saturday.    Patient Stated Goals  get rid of pain    Currently in Pain?  Yes    Pain Score  5     Pain Location  Neck    Pain Orientation  Right    Pain Descriptors / Indicators  Aching;Dull    Aggravating Factors   lifting, pushing, pulling    Pain Relieving Factors  medicine, heat, estim         [facility] PT Assessment - 01/28/19 0001      Assessment   Medical Diagnosis  Cervicalgia    Referring Provider (PT)  Dr Lynne Leader     Onset Date/Surgical Date  12/07/18    Hand Dominance  Right    Next MD Visit  none scheduled     Prior Therapy  none per pt report        [facility] Adult PT Treatment/Exercise - 01/28/19 0001      Neck Exercises: Seated   Neck Retraction  5 reps;3 secs    Lateral Flexion  Right;Left;5 reps      Neck Exercises: Supine   Neck Retraction  5 reps;10 secs    Capital Flexion Limitations  nodding yes x 10       Shoulder Exercises: Standing   Other Standing Exercises  Ws x 3 reps (irritated Rt side of neck); L's x 5 reps       Shoulder Exercises: ROM/Strengthening   UBE (Upper Arm Bike)  L1 (standing): 1.5 forward; 30 sec backward (stopped due to increased pain.       Shoulder Exercises: Stretch   Other Shoulder Stretches  doorway stretch 3 positions 30 sec hold x 3 reps (limited tolerance, cues for form.)       Moist Heat Therapy   Number Minutes Moist Heat  12 Minutes    Moist Heat Location  Cervical;Shoulder      Electrical Stimulation   Electrical Stimulation Location  Rt cervical/ posterior shoulder/ pec     Electrical Stimulation Action  IFC    Electrical Stimulation Parameters  to tolerance    Electrical Stimulation Goals  Pain      Manual Therapy   Manual therapy  comments  pt in supported supine with thin pillow under head (unable to tolerate lying flat on table)    Soft tissue mobilization  STM to bilat cervical paraspinals, upper trap, Rt levator, Rt SCM     Myofascial Release  Rt pec, platysma, scalene, and subocciptal release    Manual Traction  gentle cervical traction holds of 10 sec throughout manual                   PT Long Term Goals - 01/11/19 1014      PT LONG TERM GOAL #1   Title  Decrease cervical and UE pain/symptoms by 50-70% allowing patient to progress with stretching, strengthening, stabilization program and participate in work and home activities with greater ease    Time  6    Period  Weeks    Status  New    Target Date  02/22/19      PT LONG TERM GOAL #2   Title  Increase cervical ROM to WFL's and pain free in all planes    Time  6    Period  Weeks    Status  New    Target Date  02/22/19      PT LONG TERM GOAL #3   Title  Improve cervical and thoacic posture and alignment with patient to demonstrate improved upright posture with head in good alignment and posterior shoulder girdle  engaged    Time  6    Period  Weeks    Status  New    Target Date  02/22/19      PT LONG TERM GOAL #4   Title  Independent in HEP    Time  6    Period  Weeks    Status  New    Target Date  02/22/19      PT LONG TERM GOAL #5   Title  Improve FOTO to </= 33% limitation    Time  6    Period  Weeks    Status  New    Target Date  02/22/19            Plan - 01/28/19 1304    Clinical Impression Statement  Pt reported increase in Rt neck symptoms with UBE after 1.5 min, and was unable to tolerate doorway stretches or W's  due to increase pain in Rt shoulder girdle/neck. Pt reported some decrease in pain with suboccipital release and gentle traction, as well as estim/MHP at end of session.  Pt progressing gradually towards goals.    Stability/Clinical Decision Making  Stable/Uncomplicated    Rehab Potential  Good    PT Frequency  2x / week    PT Duration  6 weeks    PT Treatment/Interventions  Patient/family education;ADLs/Self Care Home Management;Cryotherapy;Electrical Stimulation;Iontophoresis 4mg /ml Dexamethasone;Moist Heat;Ultrasound;Manual techniques;Dry needling;Traction;Neuromuscular re-education;Therapeutic activities;Therapeutic exercise    PT Next Visit Plan  possible trial of DN; continue posture and alignment, back care education.  modalities as indicated.    PT Home Exercise Plan  [email or web address]    Consulted and Agree with Plan of Care  Patient       Patient will benefit from skilled therapeutic intervention in order to improve the following deficits and impairments:  Pain, Increased fascial restricitons, Increased muscle spasms, Hypomobility, Postural dysfunction, Improper body mechanics, Decreased mobility, Decreased range of motion, Decreased activity tolerance  Visit Diagnosis: Cervicalgia  Abnormal posture  Other symptoms and signs involving the musculoskeletal system     Problem List There are no  active problems to display for this patient.  Mayer Camel, PTA 01/28/19 1:14 PM  [facility] [place] [address] [place], [place], [address] Phone: [phone]   Fax:  [phone]  Name: Erin Little MRN: [id number] Date of Birth: 1981/04/08

## 2019-02-04 ENCOUNTER — Other Ambulatory Visit: Payer: Self-pay

## 2019-02-04 ENCOUNTER — Encounter: Payer: Self-pay | Admitting: Physical Therapy

## 2019-02-04 ENCOUNTER — Ambulatory Visit (INDEPENDENT_AMBULATORY_CARE_PROVIDER_SITE_OTHER): Payer: No Typology Code available for payment source | Admitting: Physical Therapy

## 2019-02-04 DIAGNOSIS — M542 Cervicalgia: Secondary | ICD-10-CM

## 2019-02-04 DIAGNOSIS — R29898 Other symptoms and signs involving the musculoskeletal system: Secondary | ICD-10-CM | POA: Diagnosis not present

## 2019-02-04 DIAGNOSIS — R293 Abnormal posture: Secondary | ICD-10-CM | POA: Diagnosis not present

## 2019-02-04 NOTE — Patient Instructions (Signed)

## 2019-02-04 NOTE — Therapy (Addendum)
[place], [place], [address] Phone: [phone]   Fax:  [phone]  Physical Therapy Treatment  Patient Details  Name: Zamora Colton MRN: [id number] Date of Birth: 10/17/1980 Referring Provider (PT): Dr Lynne Leader    Encounter Date: 02/04/2019  PT End of Session - 02/04/19 1405    Visit Number  5    Number of Visits  12    Date for PT Re-Evaluation  02/22/19    PT Start Time  1950    PT Stop Time  1456    PT Time Calculation (min)  51 min    Activity Tolerance  Patient tolerated treatment well    Behavior During Therapy  [facility] for tasks assessed/performed       History reviewed. No pertinent past medical history.  Past Surgical History:  Procedure Laterality Date  . APPENDECTOMY    . ELBOW ARTHROSCOPY    . TONSILLECTOMY      There were no vitals filed for this visit.  Subjective Assessment - 02/04/19 1405    Subjective  this morning and last night my neck was hurting    Patient Stated Goals  get rid of pain    Currently in Pain?  Yes    Pain Score  5     Pain Location  Neck    Pain Orientation  Right         [facility] PT Assessment - 02/04/19 0001      AROM   Overall AROM Comments  visually RoM WFL, but painful with flex/ext                   [facility] Adult PT Treatment/Exercise - 02/04/19 0001      Neck Exercises: Supine   Neck Retraction  5 reps;10 secs    Capital Flexion  10 reps      Shoulder Exercises: Standing   Horizontal ABduction  15 reps;Theraband    Theraband Level (Shoulder Horizontal ABduction)  Level 2 (Red)    Extension  Strengthening;Both;15 reps;Theraband    Theraband Level (Shoulder Extension)  Level 2 (Red)    Row  Strengthening;Both;15 reps;Theraband    Theraband Level (Shoulder Row)  Level 2 (Red)      Modalities   Modalities  Electrical Stimulation;Moist Heat      Moist Heat Therapy   Number Minutes Moist Heat  12 Minutes    Moist Heat  Location  Cervical;Shoulder      Electrical Stimulation   Electrical Stimulation Location  bil cervical    Electrical Stimulation Action  IFC    Electrical Stimulation Parameters  to tolerance    Electrical Stimulation Goals  Pain      Manual Therapy   Manual therapy comments  in prone    Soft tissue mobilization  to bil UT, levator and post neck       Trigger Point Dry Needling - 02/04/19 0001    Consent Given?  Yes    Education Handout Provided  Yes    Muscles Treated Head and Neck  Upper trapezius;Suboccipitals;Semispinalis capitus;Cervical multifidi    Upper Trapezius Response  Twitch reponse elicited;Palpable increased muscle length   bil   Semispinalis capitus Response  Twitch reponse elicited;Palpable increased muscle length   left   Cervical multifidi Response  Twitch reponse elicited;Palpable increased muscle length   left          PT Education - 02/04/19 1549    Education Details  DN ED  and aftercare    Person(s) Educated  Patient    Methods  Explanation;Demonstration;Handout    Comprehension  Verbalized understanding          PT Long Term Goals - 02/04/19 1410      PT LONG TERM GOAL #1   Title  Decrease cervical and UE pain/symptoms by 50-70% allowing patient to progress with stretching, strengthening, stabilization program and participate in work and home activities with greater ease    Baseline  not a daily throbbing anymore    Status  On-going      PT LONG TERM GOAL #2   Title  Increase cervical ROM to WFL's and pain free in all planes    Baseline  still painful with flex/ext, else WNL    Status  On-going      PT LONG TERM GOAL #3   Title  Improve cervical and thoacic posture and alignment with patient to demonstrate improved upright posture with head in good alignment and posterior shoulder girdle engaged    Status  On-going      PT LONG TERM GOAL #4   Title  Independent in HEP    Status  Partially Met            Plan - 02/04/19 1550     Clinical Impression Statement  Patient tolerated TE and DN very well today. She requires encouragement that pain will improve. She moves very cautiously with bed mobility fearing pain. 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    PT Frequency  2x / week    PT Duration  6 weeks    PT Treatment/Interventions  Patient/family education;ADLs/Self Care Home Management;Cryotherapy;Electrical Stimulation;Iontophoresis 71m/ml Dexamethasone;Moist Heat;Ultrasound;Manual techniques;Dry needling;Traction;Neuromuscular re-education;Therapeutic activities;Therapeutic exercise    PT Next Visit Plan  assess DN;  continue strengthening as tolerated, posture and alignment, back care education.  modalities as indicated.    PT Home Exercise Plan  [id number]    Consulted and Agree with Plan of Care  Patient       Patient will benefit from skilled therapeutic intervention in order to improve the following deficits and impairments:  Pain, Increased fascial restricitons, Increased muscle spasms, Hypomobility, Postural dysfunction, Improper body mechanics, Decreased mobility, Decreased range of motion, Decreased activity tolerance  Visit Diagnosis: Cervicalgia  Abnormal posture  Other symptoms and signs involving the musculoskeletal system     Problem List There are no active problems to display for this patient.   Daffney Greenly PT 02/04/2019, 4:00 PM  [facility] [place] [address] [address]Phone: [phone]  Fax:  [phone] Name: HClorissa GruenbergMRN: [id number] of Birth: 1September 13, 1982 PHYSICAL THERAPY DISCHARGE SUMMARY  Visits from Start of Care: 5  Current functional level related to goals / functional outcomes: SEE ABOVE   Remaining deficits: SEE ABOVE   Education / Equipment: HEP Plan: Patient agrees to discharge.  Patient goals were not met. Patient is being discharged due to not returning since  the last visit.  ?????     JMadelyn Flavors PT 03/22/19 8:41 PM  [facility] [place] [address] [phone](office) [phone](fax)

## 2019-04-15 ENCOUNTER — Ambulatory Visit: Payer: No Typology Code available for payment source | Attending: Internal Medicine

## 2019-04-15 DIAGNOSIS — Z20822 Contact with and (suspected) exposure to covid-19: Secondary | ICD-10-CM

## 2019-04-17 LAB — NOVEL CORONAVIRUS, NAA: SARS-CoV-2, NAA: NOT DETECTED

## 2019-09-21 ENCOUNTER — Emergency Department
Admission: EM | Admit: 2019-09-21 | Discharge: 2019-09-21 | Disposition: A | Discharge status: Home / Self Care | Payer: No Typology Code available for payment source | Source: Home / Self Care

## 2019-09-21 ENCOUNTER — Other Ambulatory Visit: Payer: Self-pay

## 2019-09-21 ENCOUNTER — Emergency Department (INDEPENDENT_AMBULATORY_CARE_PROVIDER_SITE_OTHER): Payer: No Typology Code available for payment source

## 2019-09-21 DIAGNOSIS — M79671 Pain in right foot: Secondary | ICD-10-CM

## 2019-09-21 DIAGNOSIS — M722 Plantar fascial fibromatosis: Secondary | ICD-10-CM

## 2019-09-21 DIAGNOSIS — W108XXA Fall (on) (from) other stairs and steps, initial encounter: Secondary | ICD-10-CM

## 2019-09-21 MED ORDER — ACETAMINOPHEN 325 MG PO TABS
650.0000 mg | ORAL_TABLET | Freq: Once | ORAL | Status: AC
  Administered 2019-09-21: 650 mg via ORAL

## 2019-09-21 MED ORDER — NAPROXEN 500 MG PO TABS: 500.0000 mg | ORAL_TABLET | Freq: Two times a day (BID) | ORAL | 0 refills | Status: DC | PRN

## 2019-09-21 MED ORDER — TRAMADOL HCL 50 MG PO TABS: 50.0000 mg | ORAL_TABLET | Freq: Four times a day (QID) | ORAL | 0 refills | Status: DC | PRN

## 2019-09-21 NOTE — ED Triage Notes (Signed)
Pt c/o RT foot/heel pain since last night when she slipped going down three stairs at home. Unable to bear any weight on RT heel. Pain 3/10, 10/10 when walking.

## 2019-09-21 NOTE — Discharge Instructions (Signed)
  Tramadol is strong pain medication. While taking, do not drink alcohol, drive, or perform any other activities that requires focus while taking these medications.   You may take 500mg  acetaminophen every 4-6 hours or in combination with the prescribed naproxen 500mg  every 12 hours.   You can apply a cool compress to your heel and/or roll a frozen water bottle under your foot to help stretch the soft tissue in your foot, which should also help ease the pain. Due to your reported history of plantar fascitis in the past, you may benefit from steroid injections in the bottom of your heel to help with severe pain. Call to schedule a follow up appointment with Sports Medicine later this week or next week if pain not improving.

## 2019-09-21 NOTE — ED Provider Notes (Signed)
[facility]    CSN: [id number] Arrival date & time: 09/21/19  1250      History   Chief Complaint Chief Complaint  Patient presents with  . Foot Pain    RT heel    HPI Erin Little is a 39 y.o. female.   HPI  Erin Little is a 39 y.o. female presenting to UC with c/o Right heel pain that started yesterday after she slipped going down 3 carpeted steps at home. Pt describes the incident as her foot "skidding" down the steps while she was wearing socks. She did not fall. Pain is sharp and aching, 3/10 at rest but 10/10 when applying weight to her heel. Hx of plantar fascitis in the past. She reports pain was so bad a few years ago she could not walk at that time but has not had any specific treatments for her plantar fascitis.  She took ibuprofen last night but no medication taken today. She has applied an ace wrap w/o relief. Denies ankle pain or pain in her foot besides the bottom of her heel.    History reviewed. No pertinent past medical history.  There are no problems to display for this patient.   Past Surgical History:  Procedure Laterality Date  . APPENDECTOMY    . ELBOW ARTHROSCOPY    . TONSILLECTOMY      OB History   No obstetric history on file.      Home Medications    Prior to Admission medications   Medication Sig Start Date End Date Taking? Authorizing Provider  naproxen (NAPROSYN) 500 MG tablet Take 1 tablet (500 mg total) by mouth 2 (two) times daily as needed for moderate pain. 09/21/19   Lurene Shadow, PA-C  predniSONE (DELTASONE) 20 MG tablet Take one tab by mouth twice daily for 4 days, then one daily. Take with food. 12/08/18   Lattie Haw, MD  tiZANidine (ZANAFLEX) 4 MG capsule Take 1 capsule (4 mg total) by mouth 3 (three) times daily as needed for muscle spasms. 12/10/18   Rodolph Bong, MD  traMADol (ULTRAM) 50 MG tablet Take 1 tablet (50 mg total) by mouth every 6 (six) hours as needed. 09/21/19   Lurene Shadow, PA-C     Family History History reviewed. No pertinent family history.  Social History Social History   Tobacco Use  . Smoking status: Never Smoker  . Smokeless tobacco: Never Used  Substance Use Topics  . Alcohol use: Not Currently  . Drug use: Not Currently     Allergies   Latex, Sulfamethoxazole-trimethoprim, Cephalexin, and Nitrofurantoin   Review of Systems Review of Systems  Musculoskeletal: Positive for gait problem and joint swelling. Negative for arthralgias and myalgias.  Skin: Negative for color change and wound.  Neurological: Negative for weakness and numbness.     Physical Exam Triage Vital Signs ED Triage Vitals  Enc Vitals Group     BP 09/21/19 1304 128/78     Pulse Rate 09/21/19 1304 79     Resp 09/21/19 1304 18     Temp 09/21/19 1304 98.5 F (36.9 C)     Temp Source 09/21/19 1304 Oral     SpO2 09/21/19 1304 97 %     Weight --      Height --      Head Circumference --      Peak Flow --      Pain Score 09/21/19 1305 3     Pain Loc --  Pain Edu? --      Excl. in GC? --    No data found.  Updated Vital Signs BP 128/78 (BP Location: Right Arm)   Pulse 79   Temp 98.5 F (36.9 C) (Oral)   Resp 18   LMP 09/18/2019 (Exact Date)   SpO2 97%   Visual Acuity Right Eye Distance:   Left Eye Distance:   Bilateral Distance:    Right Eye Near:   Left Eye Near:    Bilateral Near:     Physical Exam Vitals and nursing note reviewed.  Constitutional:      Appearance: She is well-developed.  HENT:     Head: Normocephalic and atraumatic.  Cardiovascular:     Rate and Rhythm: Normal rate.  Pulmonary:     Effort: Pulmonary effort is normal.  Musculoskeletal:        General: Swelling and tenderness present. Normal range of motion.     Cervical back: Normal range of motion.     Comments: Right foot: mild swelling, tenderness to bottom of heel. Full ROM ankle and toes. No tenderness of ankle, mid foot or toes.   Skin:    General: Skin is warm  and dry.     Capillary Refill: Capillary refill takes less than 2 seconds.     Findings: No erythema.     Comments: Right foot: skin in tact. No ecchymosis or erythema, small amount of dried/callused skin on plantar aspect of heel.   Neurological:     Mental Status: She is alert and oriented to person, place, and time.     Sensory: No sensory deficit.  Psychiatric:        Behavior: Behavior normal.      UC Treatments / Results  Labs (all labs ordered are listed, but only abnormal results are displayed) Labs Reviewed - No data to display  EKG   Radiology DG Foot Complete Right  Result Date: 09/21/2019 CLINICAL DATA:  Right heel pain following a fall yesterday. EXAM: RIGHT FOOT COMPLETE - 3+ VIEW COMPARISON:  None. FINDINGS: Moderate-sized inferior calcaneal enthesophyte. No fracture, dislocation or effusion seen. Minimal 1st MTP joint degenerative spur formation. IMPRESSION: No fracture. Electronically Signed   By: Beckie Salts M.D.   On: 09/21/2019 13:32    Procedures Procedures (including critical care time)  Medications Ordered in UC Medications  acetaminophen (TYLENOL) tablet 650 mg (650 mg Oral Given 09/21/19 1311)    Initial Impression / Assessment and Plan / UC Course  I have reviewed the triage vital signs and the nursing notes.  Pertinent labs & imaging results that were available during my care of the patient were reviewed by me and considered in my medical decision making (see chart for details).    Discussed imaging with pt Reassured no fracture but did point out heel spur, chronic. 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 While  taking, do not drink alcohol, drive, or perform any other activities that requires focus while taking these medications.   You may take 500mg  acetaminophen every 4-6 hours or in combination with the prescribed naproxen 500mg  every 12 hours.   You can apply a cool compress to your heel and/or roll a frozen water bottle under your foot to help stretch the soft tissue in your foot, which should also help ease the pain. Due to your reported history of plantar fascitis in the past, you may benefit from steroid injections in the bottom of your heel to help with severe pain. Call to schedule a follow up appointment with Sports Medicine later this week or next week if pain not improving.      ED Prescriptions    Medication Sig Dispense Auth. Provider   traMADol (ULTRAM) 50 MG tablet Take 1 tablet (50 mg total) by mouth every 6 (six) hours as needed. 10 tablet Gerarda Fraction, Amparo Donalson O, PA-C   naproxen (NAPROSYN) 500 MG tablet Take 1 tablet (500 mg total) by mouth 2 (two) times daily as needed for moderate pain. 30 tablet Noe Gens, [place]     I have reviewed the PDMP during this encounter.   Noe Gens, [place] 09/21/19 1419

## 2019-09-24 ENCOUNTER — Ambulatory Visit (INDEPENDENT_AMBULATORY_CARE_PROVIDER_SITE_OTHER): Payer: No Typology Code available for payment source | Admitting: Sports Medicine

## 2019-09-24 ENCOUNTER — Encounter: Payer: Self-pay | Admitting: Sports Medicine

## 2019-09-24 DIAGNOSIS — M722 Plantar fascial fibromatosis: Secondary | ICD-10-CM

## 2019-09-24 MED ORDER — MELOXICAM 15 MG PO TABS: ORAL_TABLET | ORAL | 3 refills | Status: DC

## 2019-09-24 NOTE — Assessment & Plan Note (Signed)
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 Today we injected her plantar fascia origin, adding home rehab exercises, meloxicam, air heel brace. Return to see me in 1 month.

## 2019-09-24 NOTE — Progress Notes (Signed)
    Procedures performed today:    Procedure: Real-time Ultrasound Guided injection of the right plantar fascia origin Device: Samsung HS60  Verbal informed consent obtained.  Time-out conducted.  Noted no overlying erythema, induration, or other signs of local infection.  Skin prepped in a sterile fashion.  Local anesthesia: Topical Ethyl chloride.  With sterile technique and under real time ultrasound guidance: 1 cc Kenalog 40, 1 cc lidocaine, 1 cc bupivacaine injected easily Completed without difficulty  Pain immediately resolved suggesting accurate placement of the medication.  Advised to call if fevers/chills, erythema, induration, drainage, or persistent bleeding.  Images permanently stored and available for review in the ultrasound unit.  Impression: Technically successful ultrasound guided injection.  Independent interpretation of notes and tests performed by another provider:   None.  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 Today we injected her plantar fascia origin, adding home rehab exercises, meloxicam, air heel brace. Return to see me in 1 month.    ___________________________________________ Ihor Austin. Benjamin Stain, M.D., ABFM., CAQSM. Primary Care and Sports Medicine [facility]  Adjunct Instructor of Family Medicine  [employer]

## 2019-10-21 ENCOUNTER — Ambulatory Visit: Payer: No Typology Code available for payment source | Admitting: Sports Medicine

## 2020-06-14 IMAGING — DX CERVICAL SPINE - COMPLETE 4+ VIEW
5 series · 5 of 5 positions shown · non-contrast
Comparison: None.

CLINICAL DATA: Cervicalgia

EXAM:
CERVICAL SPINE - COMPLETE 4+ VIEW

[c-spine lat]
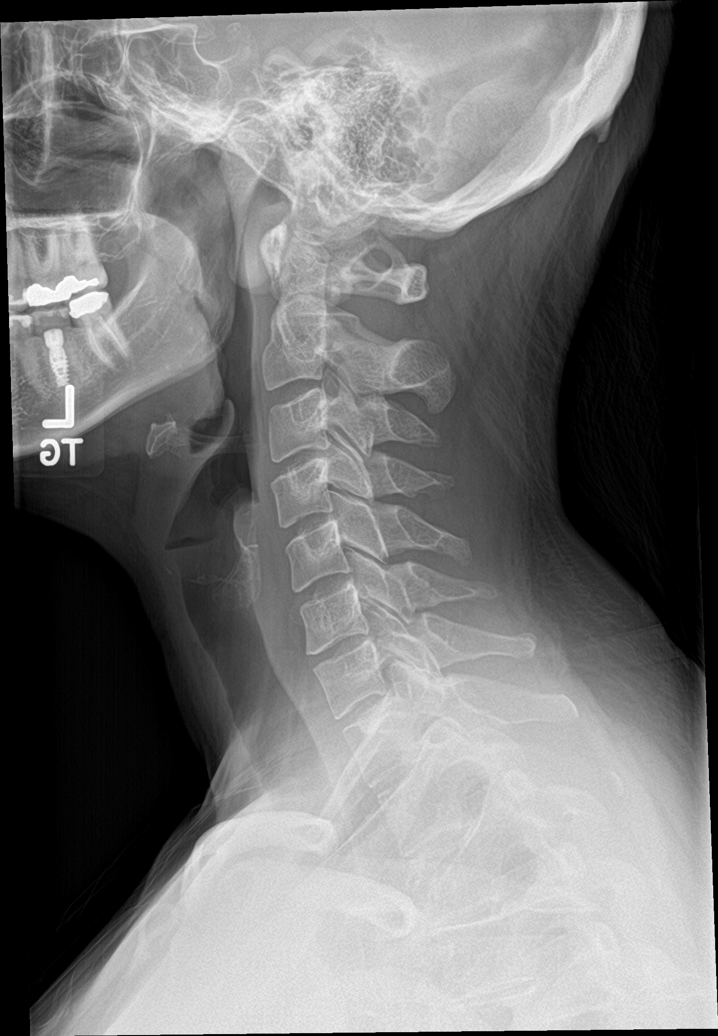

[c-spine obl (1 of 2)]
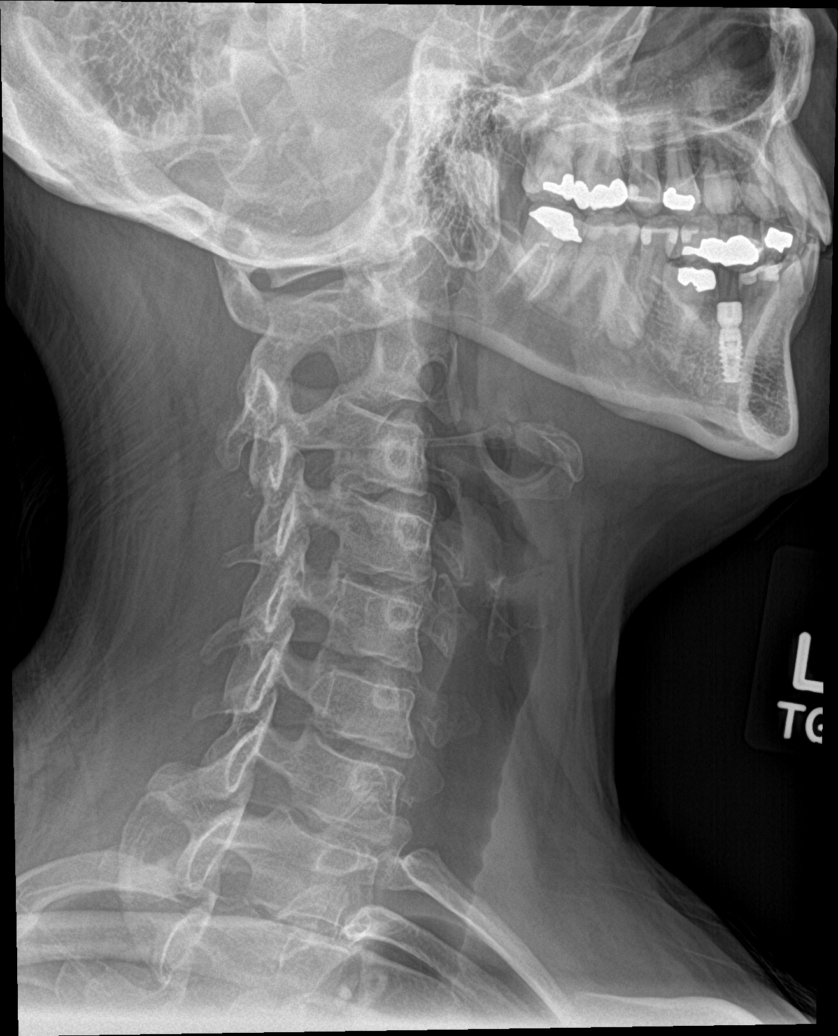

[c-spine obl (2 of 2)]
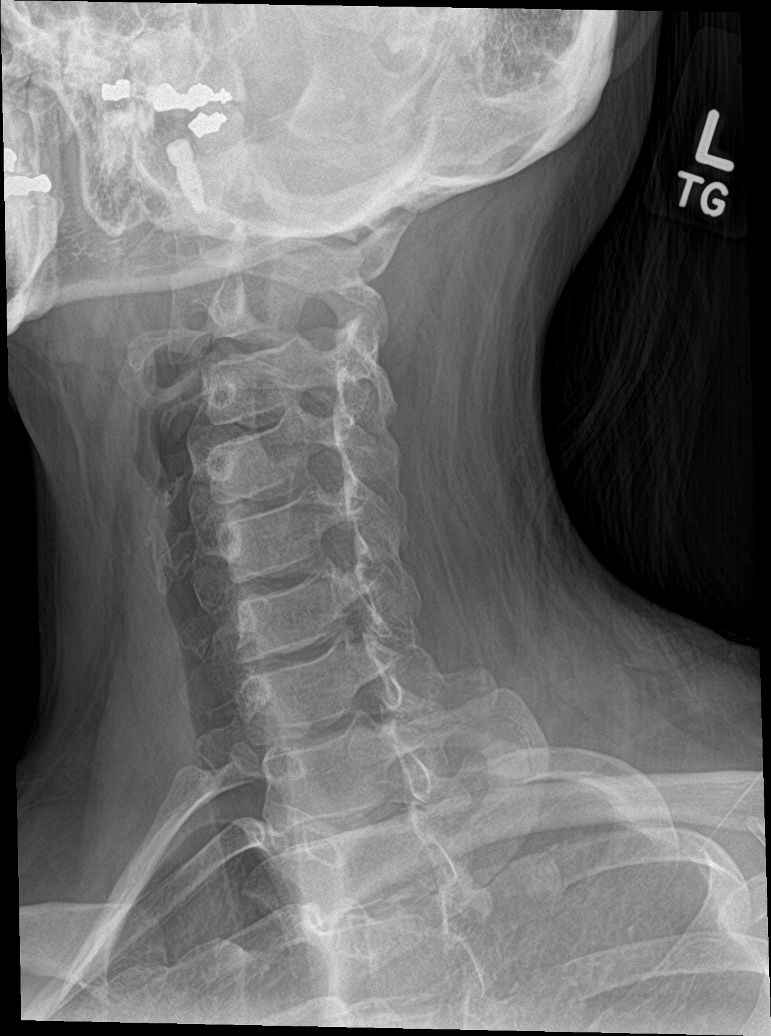

[c-spine ap]
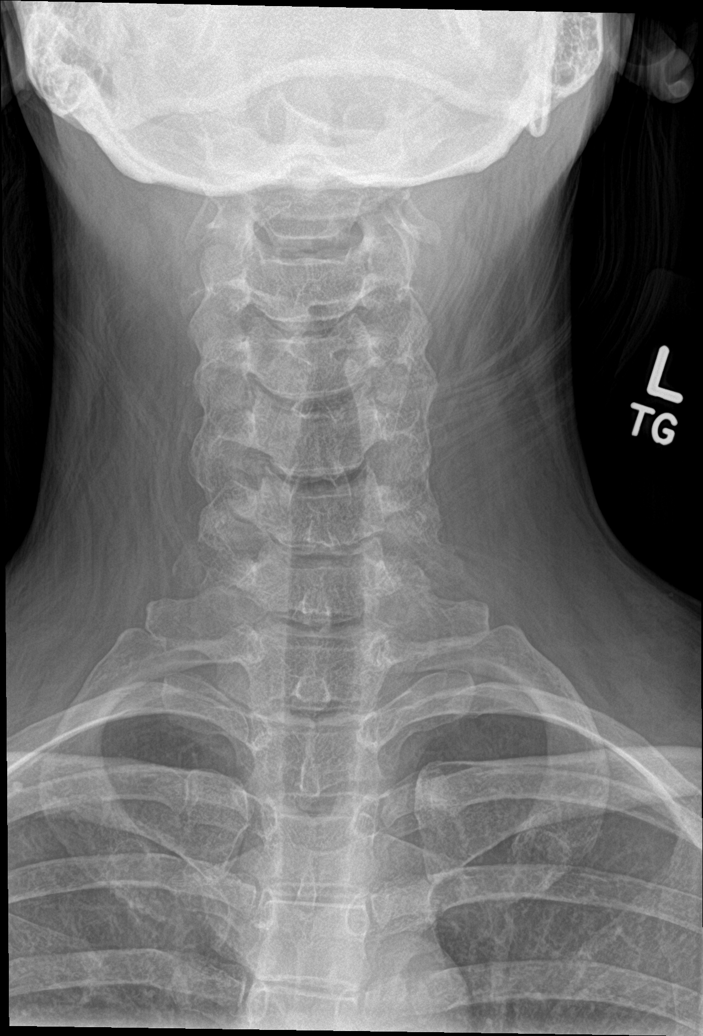

[c-spine open mouth]
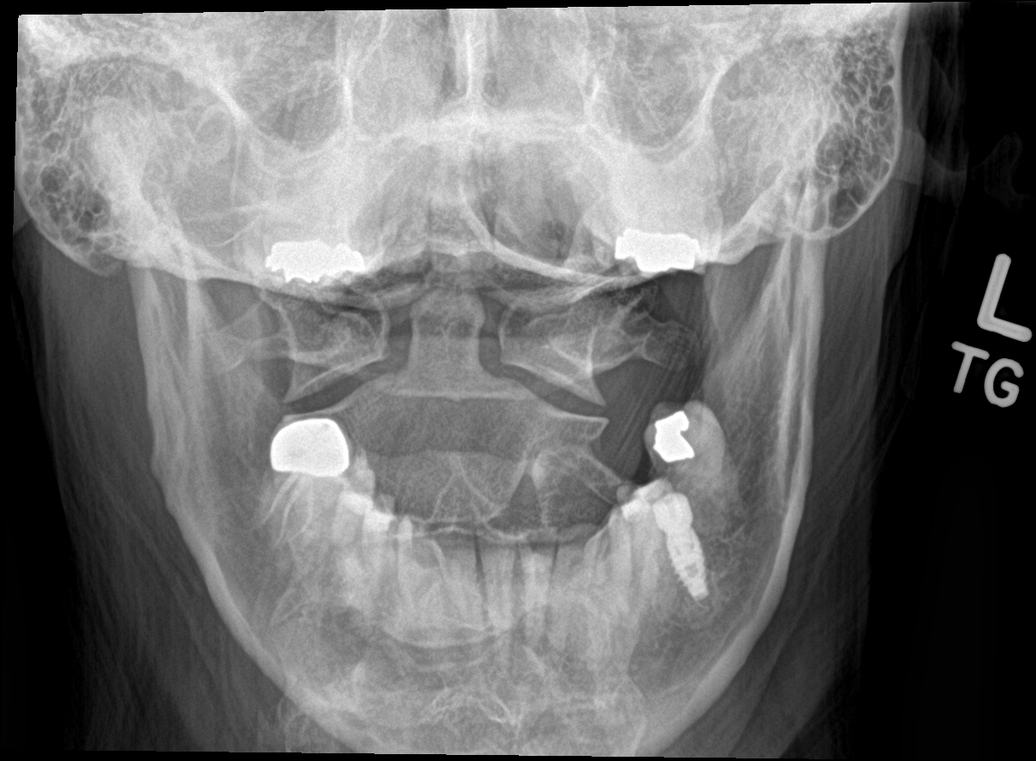

[5 of 5 positions shown; findings below may reference images not displayed]

FINDINGS: Frontal, lateral, open-mouth odontoid, and bilateral oblique views
were obtained. There is no fracture or spondylolisthesis.
Prevertebral soft tissues and predental space regions are normal.
There is moderate disc space narrowing at C6-7. Other disc spaces
appear unremarkable. There is exit foraminal narrowing on the left
at C6-7 due to bony hypertrophy. No similar changes elsewhere. Lung
apices clear.
IMPRESSION: Osteoarthritic change at C6-7, primarily on the left. Other disc
spaces appear unremarkable. No fracture or spondylolisthesis.

## 2020-09-28 ENCOUNTER — Encounter: Payer: Self-pay | Admitting: Family Medicine

## 2020-09-28 ENCOUNTER — Ambulatory Visit (INDEPENDENT_AMBULATORY_CARE_PROVIDER_SITE_OTHER): Payer: No Typology Code available for payment source | Admitting: Family Medicine

## 2020-09-28 ENCOUNTER — Other Ambulatory Visit: Payer: Self-pay

## 2020-09-28 DIAGNOSIS — B001 Herpesviral vesicular dermatitis: Secondary | ICD-10-CM

## 2020-09-28 DIAGNOSIS — F320 Major depressive disorder, single episode, mild: Secondary | ICD-10-CM

## 2020-09-28 DIAGNOSIS — G43719 Chronic migraine without aura, intractable, without status migrainosus: Secondary | ICD-10-CM

## 2020-09-28 MED ORDER — TIZANIDINE HCL 4 MG PO TABS: 4.0000 mg | ORAL_TABLET | Freq: Four times a day (QID) | ORAL | 0 refills | Status: DC | PRN

## 2020-09-28 MED ORDER — NURTEC 75 MG PO TBDP: 1.0000 | ORAL_TABLET | Freq: Every day | ORAL | 2 refills | Status: DC | PRN

## 2020-09-28 MED ORDER — BUPROPION HCL ER (XL) 150 MG PO TB24: 150.0000 mg | ORAL_TABLET | Freq: Every day | ORAL | 3 refills | Status: DC

## 2020-09-28 MED ORDER — VALACYCLOVIR HCL 1 G PO TABS: 1000.0000 mg | ORAL_TABLET | Freq: Every day | ORAL | 0 refills | Status: DC

## 2020-09-28 NOTE — Patient Instructions (Signed)
Nice to meet you! Let's plan to follow up in about 4 weeks.

## 2020-10-01 DIAGNOSIS — F320 Major depressive disorder, single episode, mild: Secondary | ICD-10-CM | POA: Insufficient documentation

## 2020-10-01 NOTE — Assessment & Plan Note (Signed)
Starting bupropion.  Discussed potential of this worsening anxiety.   Return in about 4 weeks (around 10/26/2020) for Depression/headaches.

## 2020-10-01 NOTE — Assessment & Plan Note (Signed)
Will renew rx for tizanidine.  Discussed trial of nurtec as needed for migraine.  Rx for this sent in.  Potential side effects reviewed.

## 2020-10-01 NOTE — Assessment & Plan Note (Signed)
Start valtrex as needed.

## 2020-10-01 NOTE — Progress Notes (Signed)
Erin Little - 40 y.o. female MRN [id number]  Date of birth: 02/16/1981  Subjective Chief Complaint  Patient presents with  . Establish Care  . Migraine  . Stress  . Plantar Fasciitis  . Heel Spurs    HPI Erin Little is a 40 y.o. female here today for initial visit ot establish care.  She has several concerns she would like to discuss today.    Reports history of migraines.  She has tried several medications in the past without much relief.   Tension seems to be contributing as well as these typically occur with increased stress and has pain the back of head and neck.  She has tried ibuprofen and tylenol but this doesn't help very much.  She has tried maxalt previously which did help for a period of time.  She has gotten relief with tizanadine.  She was tried on topiramate at one point as well but did not tolerated this due to changes in mood and cognition.    She complains of continued plantar fasciitis and help spur pain.  She has seen Dr. Benjamin Stain for this previously.  He did inject this which provided a few weeks of relief but symptoms returned.  She has pain with walking.  She denies swelling.    She has a history of recurrent cold sores as well.  Taken valtrex at one point but has been out.  Currently has active sore on bottom lip.  Admit sot increased stress with depressive symptoms.  She has some anxiety as well.  Tried sertraline previously and is concerned about weight gain with traditional SSRI.  She reports decreased energy levels and diminished motivation to complete tasks throughout the day.  Thinks she may have some ADD contributing.  Depression screen PHQ 2/9 09/28/2020  Decreased Interest 1  Down, Depressed, Hopeless 1  PHQ - 2 Score 2  Altered sleeping 1  Tired, decreased energy 1  Change in appetite 1  Feeling bad or failure about yourself  1  Trouble concentrating 1  Moving slowly or fidgety/restless 1  Suicidal thoughts 0  PHQ-9 Score 8  Difficult  doing work/chores Somewhat difficult   GAD 7 : Generalized Anxiety Score 09/28/2020  Nervous, Anxious, on Edge 1  Control/stop worrying 1  Worry too much - different things 1  Trouble relaxing 0  Restless 1  Easily annoyed or irritable 2  Afraid - awful might happen 1  Total GAD 7 Score 7  Anxiety Difficulty Somewhat difficult      ROS:  A comprehensive ROS was completed and negative except as noted per HPI   Allergies  Allergen Reactions  . Latex Itching and Rash    Other reaction(s): Flushing (ALLERGY/intolerance)  . Sulfamethoxazole-Trimethoprim Hives and Itching  . Cephalexin Nausea And Vomiting, Itching and Rash    Hives/vomiting   . Nitrofurantoin Nausea And Vomiting, Itching and Rash    Hives/vomiting     History reviewed. No pertinent past medical history.  Past Surgical History:  Procedure Laterality Date  . APPENDECTOMY    . ELBOW ARTHROSCOPY    . TONSILLECTOMY      Social History   Socioeconomic History  . Marital status: Divorced    Spouse name: Not on file  . Number of children: 2  . Years of education: Not on file  . Highest education level: Not on file  Occupational History  . Not on file  Tobacco Use  . Smoking status: Never Smoker  . Smokeless tobacco: Never Used  Vaping Use  . Vaping Use: Never used  Substance and Sexual Activity  . Alcohol use: Not Currently  . Drug use: Not Currently  . Sexual activity: Yes    Birth control/protection: I.U.D.  Other Topics Concern  . Not on file  Social History Narrative  . Not on file   Social Determinants of Health   Financial Resource Strain: Not on file  Food Insecurity: Not on file  Transportation Needs: Not on file  Physical Activity: Not on file  Stress: Not on file  Social Connections: Not on file    History reviewed. No pertinent family history.  Health Maintenance  Topic Date Due  . COVID-19 Vaccine (1) Never done  . HIV Screening  Never done  . Hepatitis C Screening  Never  done  . PAP SMEAR-Modifier  Never done  . INFLUENZA VACCINE  11/27/2020  . TETANUS/TDAP  05/21/2023  . Zoster Vaccines- Shingrix (1 of 2) 04/02/2029  . Pneumococcal Vaccine 85-29 Years old  Aged Out  . HPV VACCINES  Aged Out     ----------------------------------------------------------------------------------------------------------------------------------------------------------------------------------------------------------------- Physical Exam BP 125/79 (BP Location: Left Arm, Patient Position: Sitting, Cuff Size: Large)   Pulse 91   Temp 97.8 F (36.6 C)   Wt 216 lb (98 kg)   LMP 09/27/2020 (Exact Date)   SpO2 98%   BMI 35.94 kg/m   Physical Exam Constitutional:      Appearance: Normal appearance.  HENT:     Head: Normocephalic and atraumatic.  Eyes:     General: No scleral icterus. Cardiovascular:     Rate and Rhythm: Normal rate and regular rhythm.  Pulmonary:     Effort: Pulmonary effort is normal.     Breath sounds: Normal breath sounds.  Musculoskeletal:     Cervical back: Neck supple.  Skin:    Comments: Cold sore located on lower lip  Neurological:     General: No focal deficit present.     Mental Status: She is alert.  Psychiatric:        Mood and Affect: Mood normal.        Behavior: Behavior normal.     ------------------------------------------------------------------------------------------------------------------------------------------------------------------------------------------------------------------- Assessment and Plan  Chronic migraine without aura Will renew rx for tizanidine.  Discussed trial of nurtec as needed for migraine.  Rx for this sent in.  Potential side effects reviewed.    Recurrent herpes labialis Start valtrex as needed.   Depression, major, single episode, mild (HCC) Starting bupropion.  Discussed potential of this worsening anxiety.   Return in about 4 weeks (around 10/26/2020) for Depression/headaches.    Meds  ordered this encounter  Medications  . buPROPion (WELLBUTRIN XL) 150 MG 24 hr tablet    Sig: Take 1 tablet (150 mg total) by mouth daily.    Dispense:  30 tablet    Refill:  3  . tiZANidine (ZANAFLEX) 4 MG tablet    Sig: Take 1 tablet (4 mg total) by mouth every 6 (six) hours as needed for muscle spasms.    Dispense:  30 tablet    Refill:  0  . Rimegepant Sulfate (NURTEC) 75 MG TBDP    Sig: Take 1 tablet by mouth daily as needed (headache).    Dispense:  10 tablet    Refill:  2  . valACYclovir (VALTREX) 1000 MG tablet    Sig: Take 1 tablet (1,000 mg total) by mouth daily. Take for 5 days as needed for cold sore    Dispense:  30 tablet  Refill:  0    Return in about 4 weeks (around 10/26/2020) for Depression/headaches.    This visit occurred during the SARS-CoV-2 public health emergency.  Safety protocols were in place, including screening questions prior to the visit, additional usage of staff PPE, and extensive cleaning of exam room while observing appropriate contact time as indicated for disinfecting solutions.

## 2020-10-05 ENCOUNTER — Ambulatory Visit (INDEPENDENT_AMBULATORY_CARE_PROVIDER_SITE_OTHER): Payer: No Typology Code available for payment source | Admitting: Sports Medicine

## 2020-10-05 ENCOUNTER — Other Ambulatory Visit: Payer: Self-pay

## 2020-10-05 ENCOUNTER — Ambulatory Visit (INDEPENDENT_AMBULATORY_CARE_PROVIDER_SITE_OTHER): Payer: No Typology Code available for payment source

## 2020-10-05 DIAGNOSIS — M722 Plantar fascial fibromatosis: Secondary | ICD-10-CM | POA: Diagnosis not present

## 2020-10-05 MED ORDER — MELOXICAM 15 MG PO TABS: ORAL_TABLET | ORAL | 3 refills | Status: DC

## 2020-10-05 NOTE — Progress Notes (Signed)
    Procedures performed today:    Procedure: Real-time Ultrasound Guided injection of the right plantar fascia origin Device: Samsung HS60  Verbal informed consent obtained.  Time-out conducted.  Noted no overlying erythema, induration, or other signs of local infection.  Skin prepped in a sterile fashion.  Local anesthesia: Topical Ethyl chloride.  With sterile technique and under real time ultrasound guidance: noted thickened PF, 1 cc Kenalog 40, 1 cc lidocaine, 1 cc bupivacaine injected easily Completed without difficulty  Advised to call if fevers/chills, erythema, induration, drainage, or persistent bleeding.  Images permanently stored and available for review in PACS.  Impression: Technically successful ultrasound guided injection.   Independent interpretation of notes and tests performed by another provider:   None.  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 Return to see me 1 month after getting orthotics.  This is a chronic process with exacerbation and pharmacologic intervention    ___________________________________________ Ihor Austin. Benjamin Stain, M.D., ABFM., CAQSM. Primary Care and Sports Medicine [facility]  Adjunct Instructor of Family Medicine  [employer]

## 2020-10-05 NOTE — Assessment & Plan Note (Addendum)
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 Return to see me 1 month after getting orthotics.  This is a chronic process with exacerbation and pharmacologic intervention

## 2020-10-20 ENCOUNTER — Other Ambulatory Visit: Payer: Self-pay | Admitting: Family Medicine

## 2020-10-26 ENCOUNTER — Other Ambulatory Visit: Payer: Self-pay

## 2020-10-26 ENCOUNTER — Ambulatory Visit (INDEPENDENT_AMBULATORY_CARE_PROVIDER_SITE_OTHER): Payer: No Typology Code available for payment source | Admitting: Family Medicine

## 2020-10-26 ENCOUNTER — Encounter: Payer: Self-pay | Admitting: Family Medicine

## 2020-10-26 DIAGNOSIS — G43719 Chronic migraine without aura, intractable, without status migrainosus: Secondary | ICD-10-CM | POA: Diagnosis not present

## 2020-10-26 DIAGNOSIS — F320 Major depressive disorder, single episode, mild: Secondary | ICD-10-CM

## 2020-10-26 DIAGNOSIS — B001 Herpesviral vesicular dermatitis: Secondary | ICD-10-CM

## 2020-10-26 MED ORDER — NURTEC 75 MG PO TBDP: ORAL_TABLET | ORAL | 2 refills | Status: DC

## 2020-10-26 MED ORDER — TRIAMCINOLONE ACETONIDE 0.1 % EX CREA: 1.0000 "application " | TOPICAL_CREAM | Freq: Two times a day (BID) | CUTANEOUS | 0 refills | Status: DC

## 2020-10-26 MED ORDER — TIZANIDINE HCL 4 MG PO TABS: 4.0000 mg | ORAL_TABLET | Freq: Four times a day (QID) | ORAL | 0 refills | Status: DC | PRN

## 2020-10-26 MED ORDER — PREDNISONE 10 MG (21) PO TBPK: ORAL_TABLET | ORAL | 0 refills | Status: DC

## 2020-10-26 NOTE — Patient Instructions (Addendum)
Let's try switching nurtec to every other day for migraine prevention.  Continue tizanidine nightly as needed.  I have added a short course of prednisone.  Try to limit excedrin use.  See me again in 6-8 weeks or sooner if needed.   Analgesic Rebound Headache An analgesic rebound headache, sometimes called a medication overuse headache or a drug-induced headache, is a secondary disorder that is caused by the overuse of pain medicine (analgesic) to treat the original (primary) headache. Any type of primary headache can return as a rebound headache if aperson regularly takes analgesics. The types of primary headaches that are commonly associated with rebound headaches include: Migraines. Headaches that are caused by tense muscles in the head and neck area (tension headaches). Headaches that develop and happen again on one side of the head and around the eye (cluster headaches). If rebound headaches continue, they can become long-term, daily headaches. What are the causes? This condition may be caused by frequent use of: Over-the-counter medicines such as aspirin, ibuprofen, and acetaminophen. Sinus-relief medicines and medicines that contain caffeine. Narcotic pain medicines such as codeine and oxycodone. Some prescription migraine medicines. What are the signs or symptoms? The symptoms of a rebound headache are the same as the symptoms of the originalheadache. Some of the symptoms of specific types of headaches include: Migraine headache Pulsing or throbbing pain on one or both sides of the head. Severe pain that interferes with daily activities. Pain that gets worse with physical activity. Nausea, vomiting, or both. Pain and sensitivity with exposure to bright light, loud noises, or strong smells. Visual changes. Numbness of one or both arms. Tension headache Pressure around the head. Dull, aching head pain. Pain felt over the front and sides of the head. Tenderness in the muscles of  the head, neck, and shoulders. Cluster headache Severe pain that begins in or around one eye or temple. Droopy or swollen eyelid, or redness and tearing in the eye on the same side as the pain. One-sided head pain. Nausea. Runny nose. Sweaty, pale facial skin. Restlessness. How is this diagnosed? This condition is diagnosed by: Reviewing your medical history. This includes the nature of your primary headaches. Reviewing the types of pain medicines that you have been using to treat your primary headaches and how often you take them. How is this treated? This condition may be treated or managed by: Discontinuing frequent use of the analgesic medicine. Doing this may worsen your headaches at first, but the pain should eventually become more manageable, less frequent, and less severe. Seeing a headache specialist. He or she may be able to help you manage your headaches and help make sure there is not another cause of the headaches. Using methods of stress relief, such as acupuncture, counseling, biofeedback, and massage. Talk with your health care provider about which methods might be good for you. Follow these instructions at home: Medicines  Take over-the-counter and prescription medicines only as told by your health care provider. Stop the repeated use of pain medicine as told by your health care provider. Stopping can be difficult. Carefully follow instructions from your health care provider.  Lifestyle  Follow a regular sleep schedule. Do not vary the time that you go to bed or the amount that you sleep from day to day. It is important to stay on the same schedule to help prevent headaches. Get 7-9 hours of sleep each night, or the amount recommended by your health care provider. Exercise regularly. Exercise for at least 30 minutes,  5 times each week. Limit or manage stress. Consider stress-relief options such as acupuncture, counseling, biofeedback, and massage. Talk with your health  care provider about which methods might be good for you. Do not drink alcohol. Do not use any products that contain nicotine or tobacco, such as cigarettes, e-cigarettes, and chewing tobacco. If you need help quitting, ask your health care provider.  General instructions Avoid triggers that are known to cause your primary headaches. Keep all follow-up visits as told by your health care provider. This is important. Contact a health care provider if: You continue to experience headaches after following treatments that your health care provider recommended. Get help right away if you have: New headache pain. Headache pain that is different than what you have experienced in the past. Numbness or tingling in your arms or legs. Changes in your speech or vision. Summary An analgesic rebound headache, sometimes called a medication overuse headache or a drug-induced headache, is caused by the overuse of pain medicine (analgesic) to treat the original (primary) headache. Any type of primary headache can return as a rebound headache if a person regularly takes analgesics. The types of primary headaches that are commonly associated with rebound headaches include migraines, tension headaches, and cluster headaches. Analgesic rebound headaches can occur with frequent use of over-the-counter medicines and prescription medicines. Treatment involves stopping the medicine that is being overused. This will improve headache frequency and severity. This information is not intended to replace advice given to you by your health care provider. Make sure you discuss any questions you have with your healthcare provider. Document Revised: 05/13/2019 Document Reviewed: 05/13/2019 Elsevier Patient Education  2022 [employer].

## 2020-10-29 ENCOUNTER — Encounter: Payer: Self-pay | Admitting: Family Medicine

## 2020-10-29 NOTE — Assessment & Plan Note (Signed)
Stable with Valtrex as needed.

## 2020-10-29 NOTE — Progress Notes (Signed)
Erin Little - 40 y.o. female MRN [id number]  Date of birth: 08/02/1980  Subjective No chief complaint on file.   HPI Erin Little is a 40 year old female here today for follow-up visit.  She is following up today for her migraines.  At her previous visit we had added Nurtec as needed for migraine.  She also seemed to have a component of tension headache with this and tizanidine was added.  She does report that tizanidine is helpful however this makes her too sleepy to take while at work.  The Nurtec has been helpful as well however she continues to have frequent headaches.  She is using Excedrin fairly often as well.  She does report that she sleeps pretty well and tries to stay hydrated throughout the day.  She also following up on depression.  She likes bupropion at current dose.  She does not have any significant side effects at this time.  She does have a areas of rash that are itchy.  She is use hydrocortisone and gotten good relief with this however this does keep coming back.  ROS:  A comprehensive ROS was completed and negative except as noted per HPI  Allergies  Allergen Reactions   Latex Itching and Rash    Other reaction(s): Flushing (ALLERGY/intolerance)   Sulfamethoxazole-Trimethoprim Hives and Itching   Cephalexin Nausea And Vomiting, Itching and Rash    Hives/vomiting    Nitrofurantoin Nausea And Vomiting, Itching and Rash    Hives/vomiting     No past medical history on file.  Past Surgical History:  Procedure Laterality Date   APPENDECTOMY     ELBOW ARTHROSCOPY     TONSILLECTOMY      Social History   Socioeconomic History   Marital status: Divorced    Spouse name: Not on file   Number of children: 2   Years of education: Not on file   Highest education level: Not on file  Occupational History   Not on file  Tobacco Use   Smoking status: Never   Smokeless tobacco: Never  Vaping Use   Vaping Use: Never used  Substance and Sexual Activity   Alcohol  use: Not Currently   Drug use: Not Currently   Sexual activity: Yes    Birth control/protection: I.U.D.  Other Topics Concern   Not on file  Social History Narrative   Not on file   Social Determinants of Health   Financial Resource Strain: Not on file  Food Insecurity: Not on file  Transportation Needs: Not on file  Physical Activity: Not on file  Stress: Not on file  Social Connections: Not on file    No family history on file.  Health Maintenance  Topic Date Due   COVID-19 Vaccine (1) Never done   HIV Screening  Never done   Hepatitis C Screening  Never done   PAP SMEAR-Modifier  Never done   INFLUENZA VACCINE  11/27/2020   TETANUS/TDAP  05/21/2023   Pneumococcal Vaccine 72-28 Years old  Aged Out   HPV VACCINES  Aged Out     ----------------------------------------------------------------------------------------------------------------------------------------------------------------------------------------------------------------- Physical Exam BP 132/86   Pulse 88   Ht 5\' 5"  (1.651 m)   Wt 213 lb (96.6 kg)   LMP 09/27/2020 (Exact Date)   BMI 35.45 kg/m   Physical Exam Constitutional:      Appearance: Normal appearance.  Eyes:     General: No scleral icterus. Cardiovascular:     Rate and Rhythm: Normal rate and regular rhythm.  Pulmonary:  Effort: Pulmonary effort is normal.     Breath sounds: Normal breath sounds.  Musculoskeletal:     Cervical back: Neck supple.  Neurological:     General: No focal deficit present.     Mental Status: She is alert.  Psychiatric:        Mood and Affect: Mood normal.        Behavior: Behavior normal.    ------------------------------------------------------------------------------------------------------------------------------------------------------------------------------------------------------------------- Assessment and Plan  No problem-specific Assessment & Plan notes found for this encounter.   Meds  ordered this encounter  Medications   Rimegepant Sulfate (NURTEC) 75 MG TBDP    Sig: Take po every other day for migraine prevention    Dispense:  15 tablet    Refill:  2   tiZANidine (ZANAFLEX) 4 MG tablet    Sig: Take 1 tablet (4 mg total) by mouth every 6 (six) hours as needed for muscle spasms.    Dispense:  30 tablet    Refill:  0   predniSONE (STERAPRED UNI-PAK 21 TAB) 10 MG (21) TBPK tablet    Sig: Taper as directed on packaging.    Dispense:  21 tablet    Refill:  0   triamcinolone cream (KENALOG) 0.1 %    Sig: Apply 1 application topically 2 (two) times daily.    Dispense:  80 g    Refill:  0    Return in about 7 weeks (around 12/14/2020) for F/u migraines.    This visit occurred during the SARS-CoV-2 public health emergency.  Safety protocols were in place, including screening questions prior to the visit, additional usage of staff PPE, and extensive cleaning of exam room while observing appropriate contact time as indicated for disinfecting solutions.

## 2020-10-29 NOTE — Assessment & Plan Note (Signed)
She is doing well bupropion without significant worsening of depression at this time.  We will continue at current dose of 150 mg daily.

## 2020-10-29 NOTE — Assessment & Plan Note (Signed)
Her headaches seem multifactorial.  She seems to have a component of tension as well as analgesic overuse in addition to her typical migraines..  We discussed limiting Excedrin.  She does get good relief of her migraines with Nurtec.  We will going to change this to every other day for migraine prophylaxis.  We also touched on possibly adding topiramate or Emgality if this is not successful.  I am adding a course of prednisone to hopefully help with analgesic rebound.  She may continue tizanidine as needed

## 2020-11-07 ENCOUNTER — Other Ambulatory Visit (HOSPITAL_COMMUNITY): Payer: Self-pay

## 2020-11-07 MED ORDER — MELOXICAM 15 MG PO TABS
ORAL_TABLET | ORAL | 2 refills | Status: DC
  Filled 2020-12-29: qty 30, 30d supply, fill #0

## 2020-11-07 MED ORDER — TIZANIDINE HCL 4 MG PO TABS: ORAL_TABLET | ORAL | 0 refills | Status: DC

## 2020-11-07 MED ORDER — BUPROPION HCL ER (XL) 150 MG PO TB24
150.0000 mg | ORAL_TABLET | Freq: Every day | ORAL | 2 refills | Status: DC
  Filled 2020-11-07: qty 90, 90d supply, fill #0

## 2020-11-07 MED ORDER — NURTEC 75 MG PO TBDP
ORAL_TABLET | ORAL | 1 refills | Status: DC
  Filled 2020-12-18 – 2020-12-29 (×2): qty 16, 30d supply, fill #0

## 2020-11-23 ENCOUNTER — Encounter: Payer: No Typology Code available for payment source | Admitting: Family Medicine

## 2020-11-23 NOTE — Progress Notes (Deleted)
  Amelia Burgard - 40 y.o. female MRN [id number]  Date of birth: July 31, 1980  SUBJECTIVE:  Including CC & ROS.  No chief complaint on file.   Aanika Defoor is a 40 y.o. female that is  ***.  ***   Review of Systems See HPI   HISTORY: Past Medical, Surgical, Social, and Family History Reviewed & Updated per EMR.   Pertinent Historical Findings include:  No past medical history on file.  Past Surgical History:  Procedure Laterality Date  . APPENDECTOMY    . ELBOW ARTHROSCOPY    . TONSILLECTOMY      No family history on file.  Social History   Socioeconomic History  . Marital status: Divorced    Spouse name: Not on file  . Number of children: 2  . Years of education: Not on file  . Highest education level: Not on file  Occupational History  . Not on file  Tobacco Use  . Smoking status: Never  . Smokeless tobacco: Never  Vaping Use  . Vaping Use: Never used  Substance and Sexual Activity  . Alcohol use: Not Currently  . Drug use: Not Currently  . Sexual activity: Yes    Birth control/protection: I.U.D.  Other Topics Concern  . Not on file  Social History Narrative  . Not on file   Social Determinants of Health   Financial Resource Strain: Not on file  Food Insecurity: Not on file  Transportation Needs: Not on file  Physical Activity: Not on file  Stress: Not on file  Social Connections: Not on file  Intimate Partner Violence: Not on file     PHYSICAL EXAM:  VS: There were no vitals taken for this visit. Physical Exam Gen: NAD, alert, cooperative with exam, well-appearing MSK:  ***      ASSESSMENT & PLAN:   No problem-specific Assessment & Plan notes found for this encounter.

## 2020-12-18 ENCOUNTER — Other Ambulatory Visit (HOSPITAL_COMMUNITY): Payer: Self-pay

## 2020-12-21 ENCOUNTER — Ambulatory Visit (INDEPENDENT_AMBULATORY_CARE_PROVIDER_SITE_OTHER): Payer: No Typology Code available for payment source | Admitting: Family Medicine

## 2020-12-21 ENCOUNTER — Other Ambulatory Visit: Payer: Self-pay

## 2020-12-21 ENCOUNTER — Encounter: Payer: Self-pay | Admitting: Family Medicine

## 2020-12-21 DIAGNOSIS — G43719 Chronic migraine without aura, intractable, without status migrainosus: Secondary | ICD-10-CM | POA: Diagnosis not present

## 2020-12-21 MED ORDER — TIZANIDINE HCL 4 MG PO TABS: ORAL_TABLET | ORAL | 0 refills | Status: DC

## 2020-12-21 NOTE — Patient Instructions (Signed)
Great to see you today! Continue current medications.   

## 2020-12-22 ENCOUNTER — Other Ambulatory Visit (HOSPITAL_COMMUNITY): Payer: Self-pay

## 2020-12-22 MED ORDER — TIZANIDINE HCL 4 MG PO TABS
4.0000 mg | ORAL_TABLET | Freq: Four times a day (QID) | ORAL | 0 refills | Status: DC | PRN
  Filled 2020-12-22 – 2020-12-29 (×2): qty 30, 8d supply, fill #0

## 2020-12-24 NOTE — Assessment & Plan Note (Signed)
She seems to have a combination of migraine as well as tension type headaches.  Symptoms are fairly well controlled with Nurtec and we discussed trying this every other day for prophylaxis.  She may also continue meloxicam and tizanidine as needed for tension component.  We discussed that ice packs may also be helpful for this. Return in about 4 months (around 04/22/2021) for Headaches.

## 2020-12-24 NOTE — Progress Notes (Signed)
Erin Little - 40 y.o. female MRN [id number]  Date of birth: Sep 03, 1980  Subjective Chief Complaint  Patient presents with   Migraine    HPI Erin Little is a 40 year old female here today for follow-up of headaches.  She reports that overall she is doing much better.  Nurtec seems to control migraine type headaches quite well.  She is only taking as needed and not taking on a every other day basis for prophylaxis.  She does continue to have some components of tension type headaches as well and tizanidine and meloxicam seem to work pretty well for this.  She is not having to take this too often but the tizanidine does make her drowsy when she does have to take it.  ROS:  A comprehensive ROS was completed and negative except as noted per HPI  Allergies  Allergen Reactions   Latex Itching and Rash    Other reaction(s): Flushing (ALLERGY/intolerance)   Sulfamethoxazole-Trimethoprim Hives and Itching   Cephalexin Nausea And Vomiting, Itching and Rash    Hives/vomiting    Nitrofurantoin Nausea And Vomiting, Itching and Rash    Hives/vomiting     History reviewed. No pertinent past medical history.  Past Surgical History:  Procedure Laterality Date   APPENDECTOMY     ELBOW ARTHROSCOPY     TONSILLECTOMY      Social History   Socioeconomic History   Marital status: Divorced    Spouse name: Not on file   Number of children: 2   Years of education: Not on file   Highest education level: Not on file  Occupational History   Not on file  Tobacco Use   Smoking status: Never   Smokeless tobacco: Never  Vaping Use   Vaping Use: Never used  Substance and Sexual Activity   Alcohol use: Not Currently   Drug use: Not Currently   Sexual activity: Yes    Birth control/protection: I.U.D.  Other Topics Concern   Not on file  Social History Narrative   Not on file   Social Determinants of Health   Financial Resource Strain: Not on file  Food Insecurity: Not on file  Transportation  Needs: Not on file  Physical Activity: Not on file  Stress: Not on file  Social Connections: Not on file    History reviewed. No pertinent family history.  Health Maintenance  Topic Date Due   COVID-19 Vaccine (1) Never done   HIV Screening  Never done   Hepatitis C Screening  Never done   PAP SMEAR-Modifier  Never done   INFLUENZA VACCINE  11/27/2020   TETANUS/TDAP  05/21/2023   Pneumococcal Vaccine 57-85 Years old  Aged Out   HPV VACCINES  Aged Out     ----------------------------------------------------------------------------------------------------------------------------------------------------------------------------------------------------------------- Physical Exam BP 135/80 (BP Location: Left Arm, Patient Position: Sitting, Cuff Size: Normal)   Pulse 78   Temp 97.6 F (36.4 C)   Ht 5\' 5"  (1.651 m)   Wt 213 lb 9.6 oz (96.9 kg)   SpO2 100%   BMI 35.54 kg/m   Physical Exam Constitutional:      Appearance: Normal appearance.  HENT:     Head: Normocephalic and atraumatic.  Eyes:     General: No scleral icterus. Cardiovascular:     Rate and Rhythm: Normal rate and regular rhythm.  Musculoskeletal:     Cervical back: Neck supple.  Neurological:     Mental Status: She is alert.  Psychiatric:        Mood and Affect: Mood normal.  Behavior: Behavior normal.    ------------------------------------------------------------------------------------------------------------------------------------------------------------------------------------------------------------------- Assessment and Plan  Chronic migraine without aura She seems to have a combination of migraine as well as tension type headaches.  Symptoms are fairly well controlled with Nurtec and we discussed trying this every other day for prophylaxis.  She may also continue meloxicam and tizanidine as needed for tension component.  We discussed that ice packs may also be helpful for this. Return in about  4 months (around 04/22/2021) for Headaches.   Meds ordered this encounter  Medications   tiZANidine (ZANAFLEX) 4 MG tablet    Sig: Take 1 tablet by mouth every 6 hours as needed for muscle spasms.    Dispense:  30 tablet    Refill:  0    Return in about 4 months (around 04/22/2021) for Headaches.    This visit occurred during the SARS-CoV-2 public health emergency.  Safety protocols were in place, including screening questions prior to the visit, additional usage of staff PPE, and extensive cleaning of exam room while observing appropriate contact time as indicated for disinfecting solutions.

## 2020-12-28 ENCOUNTER — Other Ambulatory Visit (HOSPITAL_COMMUNITY): Payer: Self-pay

## 2020-12-29 ENCOUNTER — Other Ambulatory Visit (HOSPITAL_COMMUNITY): Payer: Self-pay

## 2021-01-02 ENCOUNTER — Telehealth: Payer: Self-pay | Admitting: Family Medicine

## 2021-01-02 ENCOUNTER — Encounter: Payer: Self-pay | Admitting: Family Medicine

## 2021-01-02 NOTE — Telephone Encounter (Signed)
Patient was in office this morning, I helped her get logged back into her MyChart and she wanted me to let PCP know that she would be sending a mychart message to him, just to be on the look out for it. AM *01/02/21

## 2021-01-18 ENCOUNTER — Ambulatory Visit (INDEPENDENT_AMBULATORY_CARE_PROVIDER_SITE_OTHER): Payer: No Typology Code available for payment source | Admitting: Family Medicine

## 2021-01-18 DIAGNOSIS — F411 Generalized anxiety disorder: Secondary | ICD-10-CM

## 2021-01-18 DIAGNOSIS — F988 Other specified behavioral and emotional disorders with onset usually occurring in childhood and adolescence: Secondary | ICD-10-CM | POA: Diagnosis not present

## 2021-01-18 DIAGNOSIS — G43719 Chronic migraine without aura, intractable, without status migrainosus: Secondary | ICD-10-CM | POA: Diagnosis not present

## 2021-01-19 ENCOUNTER — Encounter: Payer: Self-pay | Admitting: Family Medicine

## 2021-01-19 MED ORDER — TIZANIDINE HCL 4 MG PO TABS: ORAL_TABLET | ORAL | 1 refills | Status: DC

## 2021-01-19 MED ORDER — MELOXICAM 15 MG PO TABS: ORAL_TABLET | ORAL | 2 refills | Status: DC

## 2021-01-19 MED ORDER — NURTEC 75 MG PO TBDP: ORAL_TABLET | ORAL | 1 refills | Status: DC

## 2021-01-19 MED ORDER — LISDEXAMFETAMINE DIMESYLATE 30 MG PO CAPS: 30.0000 mg | ORAL_CAPSULE | Freq: Every day | ORAL | 0 refills | Status: DC

## 2021-01-21 ENCOUNTER — Encounter: Payer: Self-pay | Admitting: Family Medicine

## 2021-01-21 NOTE — Assessment & Plan Note (Signed)
Previous diagnosis as well as symptoms that are consistent with ADD.  She is awaiting further reevaluation however given her continued difficulty at work with completion and concentration on task allow to go ahead and start Vyvanse 30 mg daily.  I will plan to see her back in a month and we can consider titration based on response.

## 2021-01-21 NOTE — Assessment & Plan Note (Signed)
Combination of tension headaches with migraines.  Tension component remains well managed with meloxicam and tizanidine as needed.  She is doing well with Nurtec for migraine prevention.

## 2021-01-21 NOTE — Progress Notes (Signed)
Erin Little - 40 y.o. female MRN [id number]  Date of birth: [birth year]  Subjective Chief Complaint  Patient presents with   Follow-up    HPI Erin Little is a 40 year old female here today for follow-up of depression, migraines and anxiety.  She reports that she was recently terminated from her position due to series of errors that she made over the past year.  She does feel that her migraines are under better control with current medications.  Nurtec has been quite helpful for prevention and treatment of headaches.  She also continues tizanidine and meloxicam for tension component.  She continues to struggle with anxiety.  Currently on bupropion which has provided some relief.  She feels that there is also an ADHD component.  She was previously treated with medication for management of ADHD.  She has been referred for additional evaluation however appointment is several months away. Adult ADHD Self Report Scale (most recent)     Adult ADHD Self-Report Scale (ASRS-v1.1) Symptom Checklist - 01/21/21 2143       Part A   1. How often do you have trouble wrapping up the final details of a project, once the challenging parts have been done? Often  2. How often do you have difficulty getting things done in order when you have to do a task that requires organization? Very Often    3. How often do you have problems remembering appointments or obligations? Very Often  4. When you have a task that requires a lot of thought, how often do you avoid or delay getting started? Often    5. How often do you fidget or squirm with your hands or feet when you have to sit down for a long time? Sometimes  6. How often do you feel overly active and compelled to do things, like you were driven by a motor? Sometimes      Part B   7. How often do you make careless mistakes when you have to work on a boring or difficult project? Very Often  8. How often do you have difficulty keeping your attention when you are doing  boring or repetitive work? Often    9. How often do you have difficulty concentrating on what people say to you, even when they are speaking to you directly? Very Often  10. How often do you misplace or have difficulty finding things at home or at work? Very Often    11. How often are you distracted by activity or noise around you? Often  12. How often do you leave your seat in meetings or other situations in which you are expected to remain seated? Sometimes    13. How often do you feel restless or fidgety? Sometimes  14. How often do you have difficulty unwinding and relaxing when you have time to yourself? Often    15. How often do you find yourself talking too much when you are in social situations? Sometimes  16. When you are in a conversation, how often do you find yourself finishing the sentences of the people you are talking to, before they can finish them themselves? Sometimes    17. How often do you have difficulty waiting your turn in situations when turn taking is required? Often  18. How often do you interrupt others when they are busy? Often      Comment   How old were you when these problems first began to occur? 12  ROS:  A comprehensive ROS was completed and negative except as noted per HPI   Allergies  Allergen Reactions   Latex Itching and Rash    Other reaction(s): Flushing (ALLERGY/intolerance)   Sulfamethoxazole-Trimethoprim Hives and Itching   Cephalexin Nausea And Vomiting, Itching and Rash    Hives/vomiting    Nitrofurantoin Nausea And Vomiting, Itching and Rash    Hives/vomiting     No past medical history on file.  Past Surgical History:  Procedure Laterality Date   APPENDECTOMY     ELBOW ARTHROSCOPY     TONSILLECTOMY      Social History   Socioeconomic History   Marital status: Divorced    Spouse name: Not on file   Number of children: 2   Years of education: Not on file   Highest education level: Not on file  Occupational  History   Not on file  Tobacco Use   Smoking status: Never   Smokeless tobacco: Never  Vaping Use   Vaping Use: Never used  Substance and Sexual Activity   Alcohol use: Not Currently   Drug use: Not Currently   Sexual activity: Yes    Birth control/protection: I.U.D.  Other Topics Concern   Not on file  Social History Narrative   Not on file   Social Determinants of Health   Financial Resource Strain: Not on file  Food Insecurity: Not on file  Transportation Needs: Not on file  Physical Activity: Not on file  Stress: Not on file  Social Connections: Not on file    No family history on file.  Health Maintenance  Topic Date Due   HIV Screening  Never done   Hepatitis C Screening  Never done   PAP SMEAR-Modifier  Never done   COVID-19 Vaccine (1) 02/03/2021 (Originally 10/02/1981)   INFLUENZA VACCINE  07/27/2021 (Originally 11/27/2020)   TETANUS/TDAP  05/21/2023   HPV VACCINES  Aged Out     ----------------------------------------------------------------------------------------------------------------------------------------------------------------------------------------------------------------- Physical Exam BP 122/79   Pulse 86   Ht 5\' 5"  (1.651 m)   Wt 213 lb (96.6 kg)   SpO2 99%   BMI 35.45 kg/m   Physical Exam Constitutional:      Appearance: Normal appearance.  Eyes:     General: No scleral icterus. Cardiovascular:     Rate and Rhythm: Normal rate and regular rhythm.  Abdominal:     General: Abdomen is flat.     Palpations: Abdomen is soft.  Musculoskeletal:     Cervical back: Neck supple.  Neurological:     Mental Status: She is alert.  Psychiatric:        Mood and Affect: Mood normal.        Behavior: Behavior normal.    ------------------------------------------------------------------------------------------------------------------------------------------------------------------------------------------------------------------- Assessment and  Plan  ADD (attention deficit disorder) Previous diagnosis as well as symptoms that are consistent with ADD.  She is awaiting further reevaluation however given her continued difficulty at work with completion and concentration on task allow to go ahead and start Vyvanse 30 mg daily.  I will plan to see her back in a month and we can consider titration based on response.  Generalized anxiety disorder She continues to have difficulty with anxiety which has been worsened by her recent termination.  She is planning on filing an appeal based on her anxiety and difficulty with concentration and the fact that things are not optimally managed at this time.  Chronic migraine without aura Combination of tension headaches with migraines.  Tension component remains well managed  with meloxicam and tizanidine as needed.  She is doing well with Nurtec for migraine prevention.   No orders of the defined types were placed in this encounter.   No follow-ups on file.    This visit occurred during the SARS-CoV-2 public health emergency.  Safety protocols were in place, including screening questions prior to the visit, additional usage of staff PPE, and extensive cleaning of exam room while observing appropriate contact time as indicated for disinfecting solutions.

## 2021-01-21 NOTE — Assessment & Plan Note (Signed)
She continues to have difficulty with anxiety which has been worsened by her recent termination.  She is planning on filing an appeal based on her anxiety and difficulty with concentration and the fact that things are not optimally managed at this time.

## 2021-01-22 ENCOUNTER — Other Ambulatory Visit: Payer: Self-pay | Admitting: Family Medicine

## 2021-01-22 ENCOUNTER — Other Ambulatory Visit (HOSPITAL_COMMUNITY): Payer: Self-pay

## 2021-01-22 MED ORDER — MELOXICAM 15 MG PO TABS
ORAL_TABLET | ORAL | 2 refills | Status: DC
  Filled 2021-01-22: qty 90, 90d supply, fill #0

## 2021-01-22 MED ORDER — NURTEC 75 MG PO TBDP
ORAL_TABLET | ORAL | 1 refills | Status: DC
  Filled 2021-01-22: qty 16, 30d supply, fill #0

## 2021-01-22 MED ORDER — LISDEXAMFETAMINE DIMESYLATE 30 MG PO CAPS
30.0000 mg | ORAL_CAPSULE | Freq: Every day | ORAL | 0 refills | Status: DC
  Filled 2021-01-22: qty 30, 30d supply, fill #0

## 2021-01-22 MED ORDER — TRAZODONE HCL 50 MG PO TABS
50.0000 mg | ORAL_TABLET | Freq: Every day | ORAL | 1 refills | Status: DC
  Filled 2021-01-22: qty 90, 45d supply, fill #0

## 2021-01-22 MED ORDER — TIZANIDINE HCL 4 MG PO TABS
ORAL_TABLET | ORAL | 1 refills | Status: DC
  Filled 2021-01-22: qty 90, 23d supply, fill #0

## 2021-02-14 MED ORDER — BUPROPION HCL ER (XL) 150 MG PO TB24: 150.0000 mg | ORAL_TABLET | Freq: Every day | ORAL | 2 refills | Status: DC

## 2021-02-14 NOTE — Telephone Encounter (Signed)
Thurston Hole,   Could we get Nurtec samples from [facility] to bridge her over until new insurance starts?  Thanks!  CM

## 2021-02-14 NOTE — Addendum Note (Signed)
Addended by: Stan Head on: 02/14/2021 08:51 AM   Modules accepted: Orders

## 2021-02-21 NOTE — Telephone Encounter (Signed)
Task completed. MyChart msg sent to patient to pick up the samples at the clinic. Patient has been notified on how to take the rx.

## 2021-03-19 ENCOUNTER — Telehealth: Payer: Self-pay

## 2021-03-19 NOTE — Telephone Encounter (Signed)
Medication: Rimegepant Sulfate (NURTEC) 75 MG TBDP  Prior authorization submitted via CoverMyMeds on 03/19/2021 [place] submission pending  

## 2021-03-27 ENCOUNTER — Telehealth: Payer: Self-pay

## 2021-03-27 NOTE — Telephone Encounter (Signed)
Pt called stating she is currently without insurance and unable to purchase Vyvanse.   Any suggestions?

## 2021-03-28 IMAGING — DX DG FOOT COMPLETE 3+V*R*
3 series · 3 of 3 positions shown · non-contrast
Comparison: None.

CLINICAL DATA: Right heel pain following a fall yesterday.

EXAM:
RIGHT FOOT COMPLETE - 3+ VIEW

[foot ap]
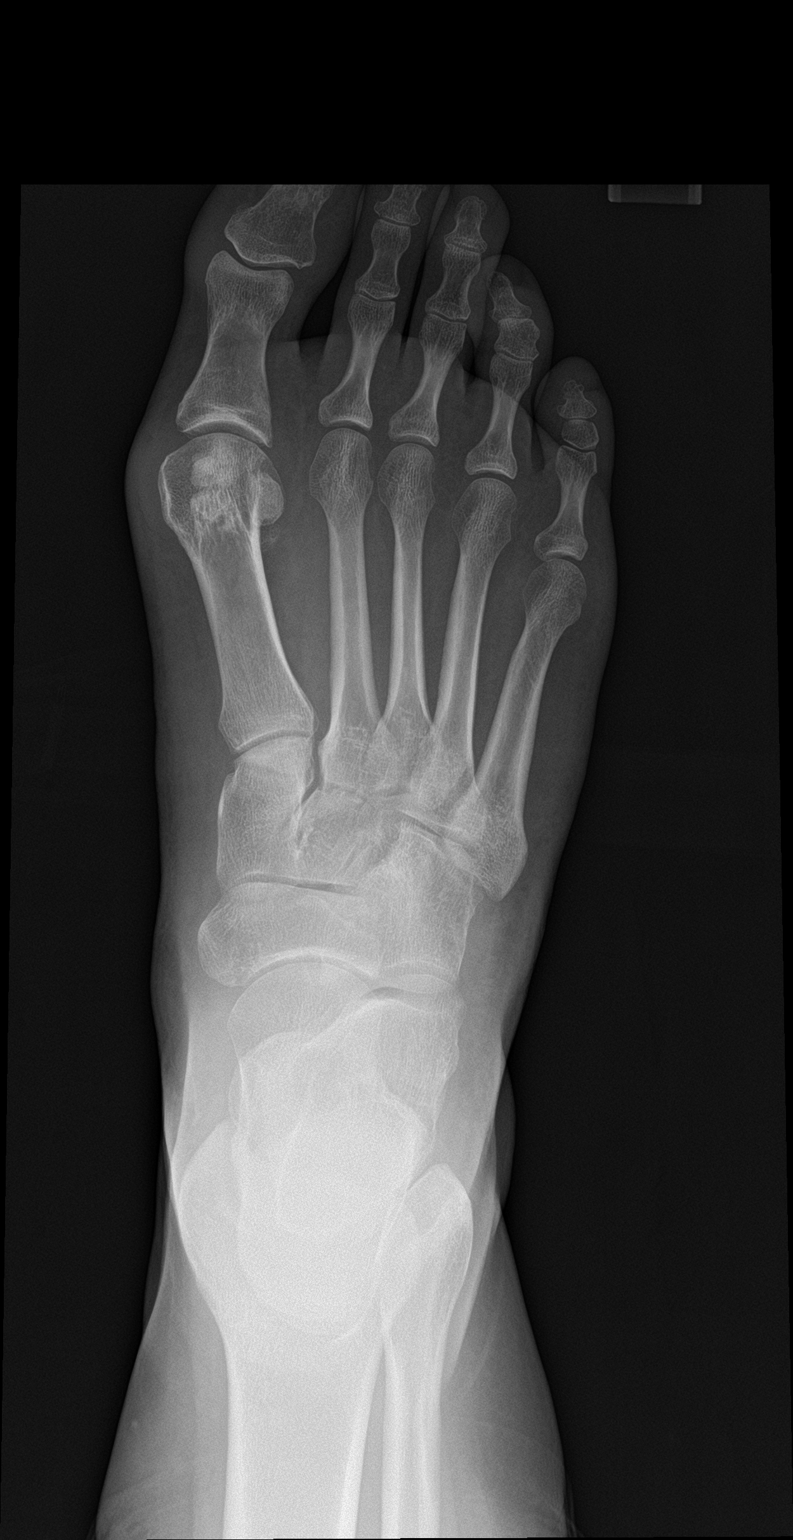

[foot obl]
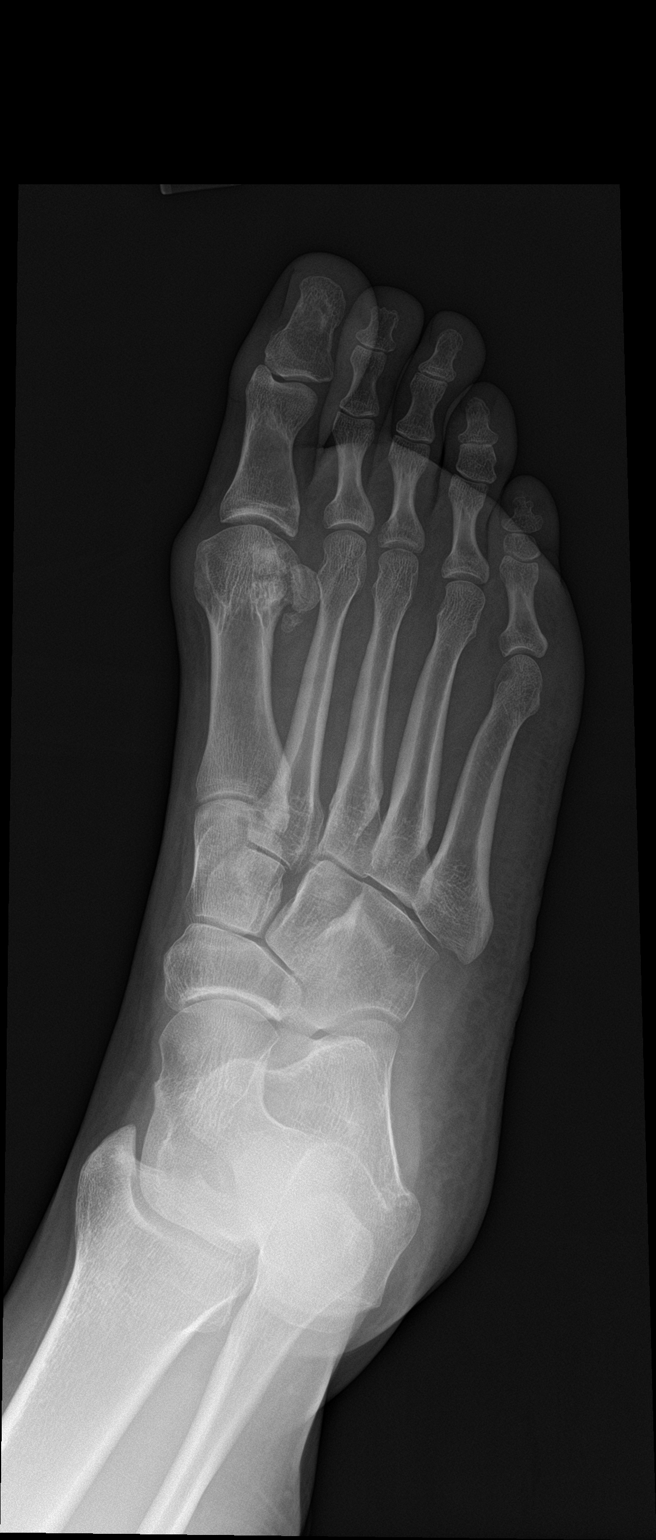

[foot lat]
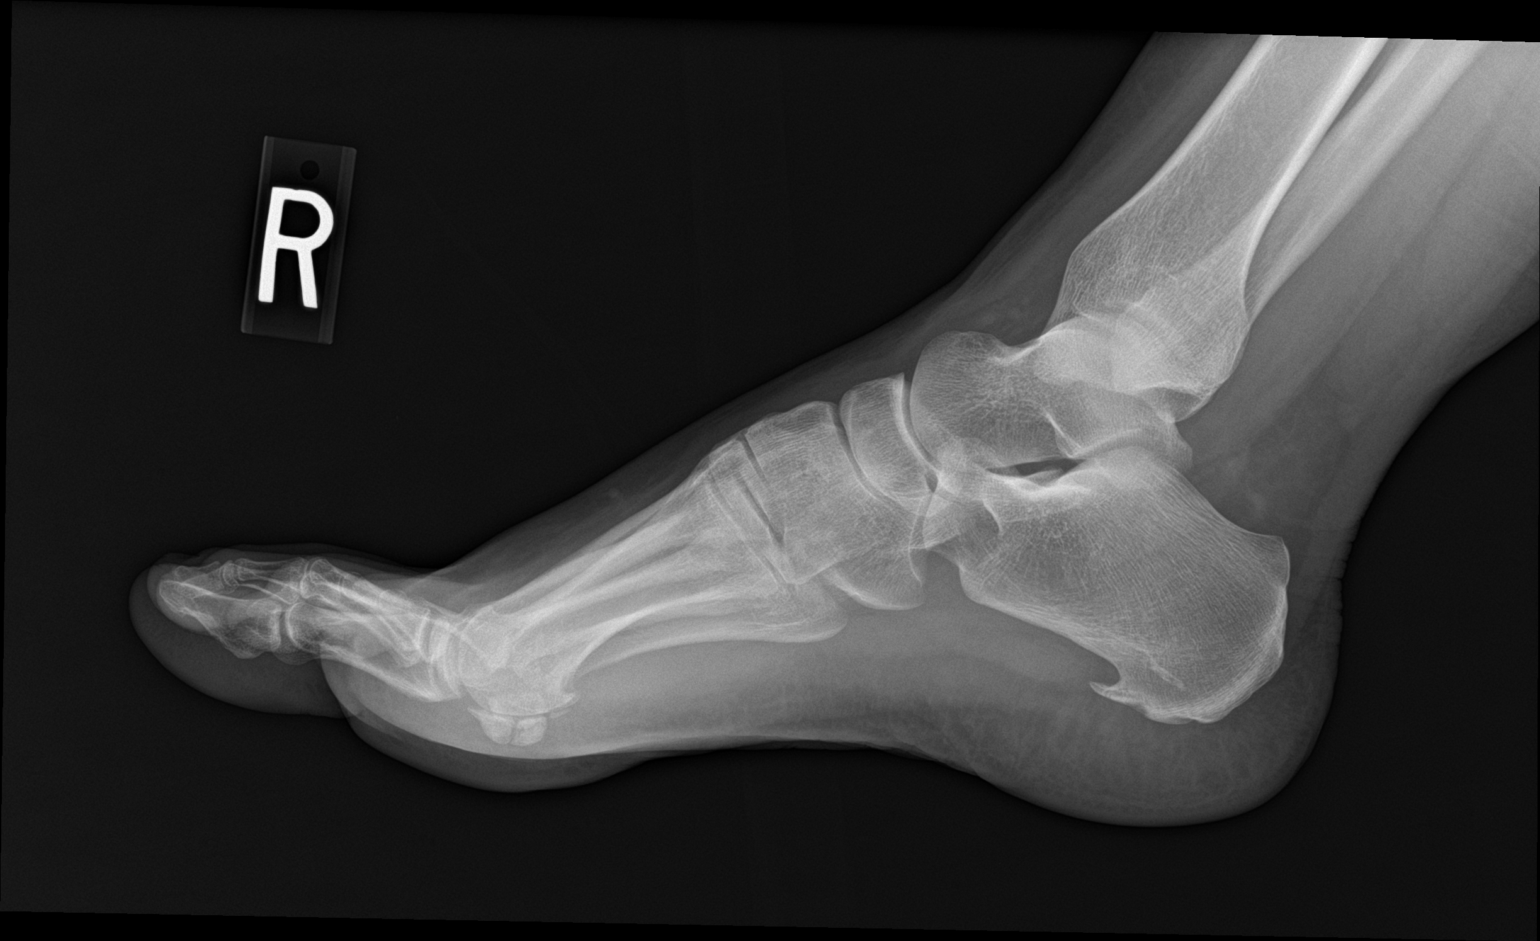

[3 of 3 positions shown; findings below may reference images not displayed]

FINDINGS: Moderate-sized inferior calcaneal enthesophyte. No fracture,
dislocation or effusion seen. Minimal 1st MTP joint degenerative
spur formation.
IMPRESSION: No fracture.

## 2021-03-28 NOTE — Telephone Encounter (Signed)
She can check prices for generic adderall through goodrx.  WE still have her nurtec samples here as well.

## 2021-04-04 NOTE — Telephone Encounter (Signed)
LVM advising patient of [facility] Adderrall. Pt to callback with location for Rx to be sent after her research.   Also, advised that Nurtec samples are still available for pick-up.   Callback information provided.

## 2021-04-06 ENCOUNTER — Other Ambulatory Visit: Payer: Self-pay | Admitting: Physician Assistant

## 2021-04-06 ENCOUNTER — Telehealth: Payer: Self-pay | Admitting: Family Medicine

## 2021-04-06 MED ORDER — AMPHETAMINE-DEXTROAMPHET ER 20 MG PO CP24
20.0000 mg | ORAL_CAPSULE | ORAL | 0 refills | Status: DC
Start: 2021-04-06 — End: 2021-04-26

## 2021-04-06 NOTE — Telephone Encounter (Signed)
Replaced vyvanse with adderall.

## 2021-04-06 NOTE — Telephone Encounter (Signed)
Task completed. Left a detailed vm msg for patient regarding adderall rx sent to requested pharmacy. Direct call back info provided.

## 2021-04-06 NOTE — Telephone Encounter (Signed)
Please find out what pharmacy? [facility] is different prices at different pharmacies. Please let her know there is also an adderall shortage right now

## 2021-04-06 NOTE — Telephone Encounter (Signed)
She would like it sent to [facility] in [place]. She would like Amphetamine Salt Combo 20 mg.

## 2021-04-06 NOTE — Telephone Encounter (Signed)
  Patient had a Voicemail from [place] telling her to let us know if she would like to try switching from Vyvanse to Generic Adderall. She came in to pick up her Nurtec samples and also stated that she would like the generic Adderall called in for her.

## 2021-04-06 NOTE — Telephone Encounter (Signed)
Task completed. Patient stopped by the clinic today for her samples of Nurtec. Samples handed directly to [facility] to give to the patient.

## 2021-04-06 NOTE — Progress Notes (Unsigned)
Replaced vyvanse with adderall XR 20mg .

## 2021-04-26 ENCOUNTER — Other Ambulatory Visit: Payer: Self-pay

## 2021-04-26 ENCOUNTER — Telehealth (INDEPENDENT_AMBULATORY_CARE_PROVIDER_SITE_OTHER): Payer: Self-pay | Admitting: Family Medicine

## 2021-04-26 ENCOUNTER — Encounter: Payer: Self-pay | Admitting: Family Medicine

## 2021-04-26 DIAGNOSIS — F988 Other specified behavioral and emotional disorders with onset usually occurring in childhood and adolescence: Secondary | ICD-10-CM

## 2021-04-26 DIAGNOSIS — F320 Major depressive disorder, single episode, mild: Secondary | ICD-10-CM

## 2021-04-26 DIAGNOSIS — G43719 Chronic migraine without aura, intractable, without status migrainosus: Secondary | ICD-10-CM

## 2021-04-26 MED ORDER — METHYLPHENIDATE HCL 20 MG PO TABS: 20.0000 mg | ORAL_TABLET | Freq: Two times a day (BID) | ORAL | 0 refills | Status: DC

## 2021-04-26 NOTE — Assessment & Plan Note (Signed)
Unable to afford Vyvanse at this time due to lapse in insurance.  Unfortunately generic Adderall is backordered.  We will see if we can get generic methylphenidate for her to manage symptoms of ADD.

## 2021-04-26 NOTE — Progress Notes (Signed)
Medication maintenance.  Generic adderall has been on backorder. She's been unable to start the medication.

## 2021-04-26 NOTE — Progress Notes (Signed)
Erin Little - 40 y.o. female MRN [id number]  Date of birth: [birth year]   This visit type was conducted due to national recommendations for restrictions regarding the COVID-19 Pandemic (e.g. social distancing).  This format is felt to be most appropriate for this patient at this time.  All issues noted in this document were discussed and addressed.  No physical exam was performed (except for noted visual exam findings with Video Visits).  I discussed the limitations of evaluation and management by telemedicine and the availability of in person appointments. The patient expressed understanding and agreed to proceed.  I connected withNAME@ on 04/26/21 at  3:10 PM EST by a video enabled telemedicine application and verified that I am speaking with the correct person using two identifiers.  Present at visit: Everrett Coombe, DO La[facility] Roes   Patient Location: Home  [address] [place] [address]   Provider location:   [facility]  Chief Complaint  Patient presents with   Follow-up    HPI  Erin Little is a 39 y.o. female who presents via audio/video conferencing for a telehealth visit today.  She is following up today for chronic conditions.  She remains without insurance.  Generic Adderall is unfortunately on backorder.  Vyvanse is too expensive for her at this time.  She does have a new job that will be starting in the next couple of weeks.  She was able to pick up Nurtec samples and her headaches have improved significantly.  Likely a stress component to this related to her job as well.  She has not needed muscle relaxer as often.  Continues on bupropion for management of depression and anxiety.  This continues to work well for her.  ROS:  A comprehensive ROS was completed and negative except as noted per HPI    ROS:  A comprehensive ROS was completed and negative except as noted per HPI  No past medical history on file.  Past Surgical History:  Procedure  Laterality Date   APPENDECTOMY     ELBOW ARTHROSCOPY     TONSILLECTOMY      No family history on file.  Social History   Socioeconomic History   Marital status: Divorced    Spouse name: Not on file   Number of children: 2   Years of education: Not on file   Highest education level: Not on file  Occupational History   Not on file  Tobacco Use   Smoking status: Never   Smokeless tobacco: Never  Vaping Use   Vaping Use: Never used  Substance and Sexual Activity   Alcohol use: Not Currently   Drug use: Not Currently   Sexual activity: Yes    Birth control/protection: I.U.D.  Other Topics Concern   Not on file  Social History Narrative   Not on file   Social Determinants of Health   Financial Resource Strain: Not on file  Food Insecurity: Not on file  Transportation Needs: Not on file  Physical Activity: Not on file  Stress: Not on file  Social Connections: Not on file  Intimate Partner Violence: Not on file     Current Outpatient Medications:    methylphenidate (RITALIN) 20 MG tablet, Take 1 tablet (20 mg total) by mouth 2 (two) times daily., Disp: 60 tablet, Rfl: 0   buPROPion (WELLBUTRIN XL) 150 MG 24 hr tablet, Take 1 tablet (150 mg total) by mouth daily., Disp: 90 tablet, Rfl: 2   meloxicam (MOBIC) 15 MG tablet, Take 1  tablet by mouth every morning for 2 weeks, then 1 tablet once a day as needed for pain with food., Disp: 90 tablet, Rfl: 2   Rimegepant Sulfate (NURTEC) 75 MG TBDP, Dissolve 1 tablet in mouth every other day for migraine prevention., Disp: 45 tablet, Rfl: 1   tiZANidine (ZANAFLEX) 4 MG tablet, Take 1 tablet by mouth every 6 hours as needed for muscle spasms., Disp: 90 tablet, Rfl: 1   traZODone (DESYREL) 50 MG tablet, Take 1-2 tablets (50-100 mg total) by mouth at bedtime., Disp: 90 tablet, Rfl: 1   triamcinolone cream (KENALOG) 0.1 %, Apply 1 application topically 2 (two) times daily., Disp: 80 g, Rfl: 0   valACYclovir (VALTREX) 1000 MG tablet,  Take 1 tablet (1,000 mg total) by mouth daily. Take for 5 days as needed for cold sore, Disp: 30 tablet, Rfl: 0  EXAM:  VITALS per patient if applicable: There were no vitals taken for this visit.  GENERAL: alert, oriented, appears well and in no acute distress  HEENT: atraumatic, conjunttiva clear, no obvious abnormalities on inspection of external nose and ears  NECK: normal movements of the head and neck  LUNGS: on inspection no signs of respiratory distress, breathing rate appears normal, no obvious gross SOB, gasping or wheezing  CV: no obvious cyanosis  MS: moves all visible extremities without noticeable abnormality  PSYCH/NEURO: pleasant and cooperative, no obvious depression or anxiety, speech and thought processing grossly intact  ASSESSMENT AND PLAN:  Discussed the following assessment and plan:  Depression, major, single episode, mild (HCC) Stable with bupropion at current strength.  Recommend continuation.  ADD (attention deficit disorder) Unable to afford Vyvanse at this time due to lapse in insurance.  Unfortunately generic Adderall is backordered.  We will see if we can get generic methylphenidate for her to manage symptoms of ADD.  Chronic migraine without aura Stable with Nurtec as needed.     I discussed the assessment and treatment plan with the patient. The patient was provided an opportunity to ask questions and all were answered. The patient agreed with the plan and demonstrated an understanding of the instructions.   The patient was advised to call back or seek an in-person evaluation if the symptoms worsen or if the condition fails to improve as anticipated.    Everrett Coombe, DO

## 2021-04-26 NOTE — Assessment & Plan Note (Signed)
Stable with bupropion at current strength.  Recommend continuation.

## 2021-04-26 NOTE — Assessment & Plan Note (Signed)
Stable with Nurtec as needed.

## 2021-06-05 ENCOUNTER — Other Ambulatory Visit: Payer: Self-pay | Admitting: Family Medicine

## 2021-10-23 ENCOUNTER — Telehealth: Payer: Self-pay | Admitting: Family Medicine

## 2021-10-24 ENCOUNTER — Other Ambulatory Visit: Payer: Self-pay

## 2021-10-24 DIAGNOSIS — Z0000 Encounter for general adult medical examination without abnormal findings: Secondary | ICD-10-CM

## 2021-10-24 NOTE — Telephone Encounter (Signed)
Lab orders completed

## 2021-10-24 NOTE — Telephone Encounter (Signed)
Called patient at 3:37 to schedule a lab visit. No answer and vm not set up.gh

## 2021-10-24 NOTE — Progress Notes (Signed)
Protocol lab orders have been entered for patient due to request.

## 2021-11-01 ENCOUNTER — Emergency Department
Admission: EM | Admit: 2021-11-01 | Discharge: 2021-11-01 | Disposition: A | Discharge status: Home / Self Care | Payer: Managed Care, Other (non HMO) | Source: Home / Self Care

## 2021-11-01 ENCOUNTER — Encounter: Payer: Self-pay | Admitting: Emergency Medicine

## 2021-11-01 DIAGNOSIS — M79601 Pain in right arm: Secondary | ICD-10-CM

## 2021-11-01 DIAGNOSIS — M79602 Pain in left arm: Secondary | ICD-10-CM | POA: Diagnosis not present

## 2021-11-01 MED ORDER — DICLOFENAC SODIUM 75 MG PO TBEC: 75.0000 mg | DELAYED_RELEASE_TABLET | Freq: Two times a day (BID) | ORAL | 0 refills | Status: AC

## 2021-11-01 NOTE — ED Provider Notes (Addendum)
[facility]    CSN: [id number] Arrival date & time: 11/01/21  1650      History   Chief Complaint Chief Complaint  Patient presents with   Arm Pain    HPI Erin Little is a 41 y.o. female.   HPI 41 year old female presents with bilateral arm pain for several months.  Denies injury or insult to either arm.  Reports simples task becoming harder for her to perform and difficulty sleeping at night due to arm pain patient reports taking Mobic with little to no relief.  Additionally, patient reports majority of bilateral arm pain is centered around bilateral elbows.  Reports history of right elbow fracture at age 58.  PMH significant for morbid obesity, interstitial cystitis and chronic migraine.  History reviewed. No pertinent past medical history.  Patient Active Problem List   Diagnosis Date Noted   Depression, major, single episode, mild (HCC) 10/01/2020   Plantar fasciitis, right 09/24/2019   Generalized anxiety disorder 06/28/2015   Insomnia 06/28/2015   IUD (intrauterine device) in place 06/28/2015   Recurrent herpes labialis 06/28/2015   ADD (attention deficit disorder) 10/13/2013   Chronic migraine without aura 10/13/2013   Interstitial cystitis (chronic) without hematuria 10/13/2013   Foot pain, bilateral 06/30/2012    Past Surgical History:  Procedure Laterality Date   APPENDECTOMY     ELBOW ARTHROSCOPY     TONSILLECTOMY      OB History   No obstetric history on file.      Home Medications    Prior to Admission medications   Medication Sig Start Date End Date Taking? Authorizing Provider  buPROPion (WELLBUTRIN XL) 150 MG 24 hr tablet Take 1 tablet (150 mg total) by mouth daily. 02/14/21  Yes Everrett Coombe, DO  diclofenac (VOLTAREN) 75 MG EC tablet Take 1 tablet (75 mg total) by mouth 2 (two) times daily for 10 days. 11/01/21 11/11/21 Yes Trevor Iha, FNP  methylphenidate (RITALIN) 20 MG tablet TAKE ONE TABLET BY MOUTH TWICE DAILY 06/06/21  Yes  Everrett Coombe, DO  Rimegepant Sulfate (NURTEC) 75 MG TBDP Dissolve 1 tablet in mouth every other day for migraine prevention. 01/22/21  Yes Everrett Coombe, DO  tiZANidine (ZANAFLEX) 4 MG tablet Take 1 tablet by mouth every 6 hours as needed for muscle spasms. 01/22/21  Yes Everrett Coombe, DO  traZODone (DESYREL) 50 MG tablet Take 1-2 tablets (50-100 mg total) by mouth at bedtime. 01/22/21  Yes Everrett Coombe, DO  triamcinolone cream (KENALOG) 0.1 % Apply 1 application topically 2 (two) times daily. 10/26/20  Yes Everrett Coombe, DO  valACYclovir (VALTREX) 1000 MG tablet Take 1 tablet (1,000 mg total) by mouth daily. Take for 5 days as needed for cold sore 09/28/20  Yes Everrett Coombe, DO    Family History History reviewed. No pertinent family history.  Social History Social History   Tobacco Use   Smoking status: Never   Smokeless tobacco: Never  Vaping Use   Vaping Use: Never used  Substance Use Topics   Alcohol use: Not Currently   Drug use: Not Currently     Allergies   Latex, Sulfamethoxazole-trimethoprim, Cephalexin, and Nitrofurantoin   Review of Systems Review of Systems  Musculoskeletal:        Bilateral arm pain for 3 months.     Physical Exam Triage Vital Signs ED Triage Vitals  Enc Vitals Group     BP 11/01/21 1705 132/85     Pulse Rate 11/01/21 1705 65     Resp 11/01/21  1705 18     Temp 11/01/21 1705 98.6 F (37 C)     Temp Source 11/01/21 1705 Oral     SpO2 11/01/21 1705 100 %     Weight 11/01/21 1706 217 lb (98.4 kg)     Height 11/01/21 1706 5\' 4"  (1.626 m)     Head Circumference --      Peak Flow --      Pain Score 11/01/21 1706 7     Pain Loc --      Pain Edu? --      Excl. in GC? --    No data found.  Updated Vital Signs BP 132/85 (BP Location: Right Arm)   Pulse 65   Temp 98.6 F (37 C) (Oral)   Resp 18   Ht 5\' 4"  (1.626 m)   Wt 217 lb (98.4 kg)   LMP 10/22/2021   SpO2 100%   BMI 37.25 kg/m      Physical Exam Vitals and nursing  note reviewed.  Constitutional:      Appearance: Normal appearance. She is obese.  HENT:     Head: Normocephalic and atraumatic.     Mouth/Throat:     Mouth: Mucous membranes are moist.     Pharynx: Oropharynx is clear.  Eyes:     Extraocular Movements: Extraocular movements intact.     Conjunctiva/sclera: Conjunctivae normal.     Pupils: Pupils are equal, round, and reactive to light.  Cardiovascular:     Rate and Rhythm: Normal rate and regular rhythm.     Pulses: Normal pulses.     Heart sounds: Normal heart sounds.  Pulmonary:     Effort: Pulmonary effort is normal.     Breath sounds: Normal breath sounds. No wheezing, rhonchi or rales.  Musculoskeletal:        General: Normal range of motion.     Cervical back: Normal range of motion and neck supple.  Skin:    General: Skin is warm and dry.  Neurological:     General: No focal deficit present.     Mental Status: She is alert and oriented to person, place, and time.      UC Treatments / Results  Labs (all labs ordered are listed, but only abnormal results are displayed) Labs Reviewed - No data to display  EKG   Radiology No results found.  Procedures Procedures (including critical care time)  Medications Ordered in UC Medications - No data to display  Initial Impression / Assessment and Plan / UC Course  I have reviewed the triage vital signs and the nursing notes.  Pertinent labs & imaging results that were available during my care of the patient were reviewed by me and considered in my medical decision making (see chart for details).     MDM: Patient declined imaging of both arms this evening.1.  Left arm pain-Rx'd Diclofenac; 2.  Right arm pain-Rx'd Diclofenac. Instructed patient to discontinue Mobic now.  Advised patient to take medication as directed with food to completion.  Encouraged patient increase daily water intake while taking this medication.  Advised if symptoms worsen and/or unresolved please  follow-up with PCP or here for further evaluation.  Patient discharged home, hemodynamically stable Final Clinical Impressions(s) / UC Diagnoses   Final diagnoses:  Left arm pain  Right arm pain     Discharge Instructions      Instructed patient to discontinue Mobic now.  Advised patient to take medication as directed with food to completion.  Encouraged patient increase daily water intake while taking this medication.  Advised if symptoms worsen and/or unresolved please follow-up with PCP or here for further evaluation.     ED Prescriptions     Medication Sig Dispense Auth. Provider   diclofenac (VOLTAREN) 75 MG EC tablet Take 1 tablet (75 mg total) by mouth 2 (two) times daily for 10 days. 20 tablet Trevor Iha, FNP      PDMP not reviewed this encounter.   Trevor Iha, FNP 11/01/21 1816    Trevor Iha, FNP 11/01/21 Paulo Fruit

## 2021-11-01 NOTE — ED Triage Notes (Signed)
Patient c/o bilateral arm pain for several months.  No apparent injury.  Simple tasks are becoming harder for patient to perform, difficulty sleeping at night.  Patient has been taken Meloxicam w/o any relief.

## 2021-11-01 NOTE — Discharge Instructions (Addendum)
Instructed patient to discontinue Mobic now.  Advised patient to take medication as directed with food to completion.  Encouraged patient increase daily water intake while taking this medication.  Advised if symptoms worsen and/or unresolved please follow-up with PCP or here for further evaluation.

## 2021-11-22 ENCOUNTER — Ambulatory Visit (INDEPENDENT_AMBULATORY_CARE_PROVIDER_SITE_OTHER): Payer: Managed Care, Other (non HMO)

## 2021-11-22 ENCOUNTER — Ambulatory Visit (INDEPENDENT_AMBULATORY_CARE_PROVIDER_SITE_OTHER): Payer: Managed Care, Other (non HMO) | Admitting: Sports Medicine

## 2021-11-22 DIAGNOSIS — M722 Plantar fascial fibromatosis: Secondary | ICD-10-CM | POA: Diagnosis not present

## 2021-11-22 DIAGNOSIS — M7711 Lateral epicondylitis, right elbow: Secondary | ICD-10-CM | POA: Diagnosis not present

## 2021-11-22 MED ORDER — DICLOFENAC SODIUM 75 MG PO TBEC: 75.0000 mg | DELAYED_RELEASE_TABLET | Freq: Two times a day (BID) | ORAL | 3 refills | Status: DC

## 2021-11-22 NOTE — Assessment & Plan Note (Signed)
Recurrence of plantar fasciitis. Last injected June 2022. Now with recurrence of pain. 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 We will do diclofenac as above, restart home conditioning and have her come back to see me in about 4 to 6 weeks and will consider repeat injection if not better.

## 2021-11-22 NOTE — Addendum Note (Signed)
Addended by: Monica Becton on: 11/22/2021 09:30 AM   Modules accepted: Orders

## 2021-11-22 NOTE — Progress Notes (Signed)
    Procedures performed today:    Procedure: Real-time Ultrasound Guided injection of the right common extensor tendon origin Device: Samsung HS60  Verbal informed consent obtained.  Time-out conducted.  Noted no overlying erythema, induration, or other signs of local infection.  Skin prepped in a sterile fashion.  Local anesthesia: Topical Ethyl chloride.  With sterile technique and under real time ultrasound guidance: Noted minimal gapping of the tendon, 1 cc Kenalog 40, 1 cc lidocaine, 1 cc bupivacaine injected easily Completed without difficulty  Advised to call if fevers/chills, erythema, induration, drainage, or persistent bleeding.  Images permanently stored and available for review in PACS.  Impression: Technically successful ultrasound guided injection.  Independent interpretation of notes and tests performed by another provider:   None.  Brief History, Exam, Impression, and Recommendations:    Lateral epicondylitis, right elbow Pleasant 41 year old female, 3 months of pain right lateral elbow, worse with gripping, on exam she has tenderness at the common extensor tendon origin. Desires injection, we will also add a tennis elbow brace, home conditioning, diclofenac. Return to see me in about 4 to 6 weeks.  Plantar fasciitis, right Recurrence of plantar fasciitis. Last injected June 2022. Now with recurrence of pain. 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 We will do diclofenac as above, restart home conditioning and have her come back to see me in about 4 to 6 weeks and will consider repeat injection if not better.    ____________________________________________ Ihor Austin. Benjamin Stain, M.D., ABFM., CAQSM., AME. Primary Care and Sports Medicine [facility]  Adjunct Professor of Family Medicine  [employer]  Restaurant manager, fast food

## 2021-11-22 NOTE — Assessment & Plan Note (Signed)
Pleasant 41 year old female, 3 months of pain right lateral elbow, worse with gripping, on exam she has tenderness at the common extensor tendon origin. Desires injection, we will also add a tennis elbow brace, home conditioning, diclofenac. Return to see me in about 4 to 6 weeks.

## 2021-12-14 ENCOUNTER — Ambulatory Visit
Admission: EM | Admit: 2021-12-14 | Discharge: 2021-12-14 | Disposition: A | Discharge status: Home / Self Care | Payer: Managed Care, Other (non HMO) | Attending: Family Medicine | Admitting: Family Medicine

## 2021-12-14 ENCOUNTER — Encounter: Payer: Self-pay | Admitting: Emergency Medicine

## 2021-12-14 DIAGNOSIS — J069 Acute upper respiratory infection, unspecified: Secondary | ICD-10-CM

## 2021-12-14 DIAGNOSIS — Z20822 Contact with and (suspected) exposure to covid-19: Secondary | ICD-10-CM | POA: Diagnosis not present

## 2021-12-14 DIAGNOSIS — U071 COVID-19: Secondary | ICD-10-CM | POA: Insufficient documentation

## 2021-12-14 NOTE — ED Provider Notes (Signed)
[name]    CSN: [id number] Arrival date & time: 12/14/21  1645      History   Chief Complaint Chief Complaint  Patient presents with   Covid Positive    HPI Erin Little is a 41 y.o. female.   Three days ago patient began to feel ill and fatigued with onset of sinus congestion.  She next developed sore throat, headache, tightness in her chest with mild sensation of shortness of breath, and chills.  She has an occasional minimal cough.  She had a positive home COVID test today.  She has had COVID infection in the past, but no COVID vaccine.  The history is provided by the patient.    History reviewed. No pertinent past medical history.  Patient Active Problem List   Diagnosis Date Noted   Lateral epicondylitis, right elbow 11/22/2021   Depression, major, single episode, mild (HCC) 10/01/2020   Plantar fasciitis, right 09/24/2019   Generalized anxiety disorder 06/28/2015   Insomnia 06/28/2015   IUD (intrauterine device) in place 06/28/2015   Recurrent herpes labialis 06/28/2015   ADD (attention deficit disorder) 10/13/2013   Chronic migraine without aura 10/13/2013   Interstitial cystitis (chronic) without hematuria 10/13/2013   Foot pain, bilateral 06/30/2012    Past Surgical History:  Procedure Laterality Date   APPENDECTOMY     ELBOW ARTHROSCOPY     TONSILLECTOMY      OB History   No obstetric history on file.      Home Medications    Prior to Admission medications   Medication Sig Start Date End Date Taking? Authorizing Provider  buPROPion (WELLBUTRIN XL) 150 MG 24 hr tablet Take 1 tablet (150 mg total) by mouth daily. Patient not taking: Reported on 12/14/2021 02/14/21   Everrett Coombe, DO  diclofenac (VOLTAREN) 75 MG EC tablet Take 1 tablet (75 mg total) by mouth 2 (two) times daily. 11/22/21 11/22/22  Monica Becton, MD  methylphenidate (RITALIN) 20 MG tablet TAKE ONE TABLET BY MOUTH TWICE DAILY Patient not taking: Reported on  12/14/2021 06/06/21   Everrett Coombe, DO  Rimegepant Sulfate (NURTEC) 75 MG TBDP Dissolve 1 tablet in mouth every other day for migraine prevention. Patient not taking: Reported on 12/14/2021 01/22/21   Everrett Coombe, DO  tiZANidine (ZANAFLEX) 4 MG tablet Take 1 tablet by mouth every 6 hours as needed for muscle spasms. Patient not taking: Reported on 12/14/2021 01/22/21   Everrett Coombe, DO  traZODone (DESYREL) 50 MG tablet Take 1-2 tablets (50-100 mg total) by mouth at bedtime. Patient not taking: Reported on 12/14/2021 01/22/21   Everrett Coombe, DO  triamcinolone cream (KENALOG) 0.1 % Apply 1 application topically 2 (two) times daily. Patient not taking: Reported on 12/14/2021 10/26/20   Everrett Coombe, DO  valACYclovir (VALTREX) 1000 MG tablet Take 1 tablet (1,000 mg total) by mouth daily. Take for 5 days as needed for cold sore Patient not taking: Reported on 12/14/2021 09/28/20   Everrett Coombe, DO    Family History History reviewed. No pertinent family history.  Social History Social History   Tobacco Use   Smoking status: Never   Smokeless tobacco: Never  Vaping Use   Vaping Use: Never used  Substance Use Topics   Alcohol use: Not Currently   Drug use: Not Currently     Allergies   Latex, Sulfamethoxazole-trimethoprim, Cephalexin, and Nitrofurantoin   Review of Systems Review of Systems + sore throat ? cough No pleuritic pain No wheezing + nasal congestion + post-nasal  drainage No sinus pain/pressure No itchy/red eyes No earache No hemoptysis ? SOB ? fever, + chills No nausea No vomiting No abdominal pain No diarrhea No urinary symptoms No skin rash + fatigue + myalgias + headache Used OTC meds (Tylenol) without relief   Physical Exam Triage Vital Signs ED Triage Vitals  Enc Vitals Group     BP 12/14/21 1656 111/77     Pulse Rate 12/14/21 1656 84     Resp 12/14/21 1656 16     Temp 12/14/21 1656 98.3 F (36.8 C)     Temp Source 12/14/21 1656 Axillary      SpO2 12/14/21 1656 97 %     Weight 12/14/21 1657 217 lb (98.4 kg)     Height 12/14/21 1657 5\' 4"  (1.626 m)     Head Circumference --      Peak Flow --      Pain Score 12/14/21 1657 4     Pain Loc --      Pain Edu? --      Excl. in GC? --    No data found.  Updated Vital Signs BP 111/77 (BP Location: Right Arm)   Pulse 84   Temp 98.3 F (36.8 C) (Axillary) Comment: drank a slushy pta  Resp 16   Ht 5\' 4"  (1.626 m)   Wt 98.4 kg   LMP 11/19/2021 (Approximate)   SpO2 97%   BMI 37.25 kg/m   Visual Acuity Right Eye Distance:   Left Eye Distance:   Bilateral Distance:    Right Eye Near:   Left Eye Near:    Bilateral Near:     Physical Exam Nursing notes and Vital Signs reviewed. Appearance:  Patient appears stated age, and in no acute distress Eyes:  Pupils are equal, round, and reactive to light and accomodation.  Extraocular movement is intact.  Conjunctivae are not inflamed  Ears:  Canals normal.  Tympanic membranes normal.  Nose:  Mildly congested turbinates.  No sinus tenderness.  Pharynx:  Normal; moist mucous membranes. Neck:  Supple.  Mildly enlarged lateral nodes are present, tender to palpation on the left.   Lungs:  Clear to auscultation.  Breath sounds are equal.  Moving air well. Heart:  Regular rate and rhythm without murmurs, rubs, or gallops.  Abdomen:  Nontender without masses or hepatosplenomegaly.  Bowel sounds are present.  No CVA or flank tenderness.  Extremities:  No edema.  Skin:  No rash present.   UC Treatments / Results  Labs (all labs ordered are listed, but only abnormal results are displayed) Labs Reviewed  RESP PANEL BY RT-PCR (FLU A&B, COVID) [id number]    EKG   Radiology No results found.  Procedures Procedures (including critical care time)  Medications Ordered in UC Medications - No data to display  Initial Impression / Assessment and Plan / UC Course  I have reviewed the triage vital signs and the nursing notes.  Pertinent  labs & imaging results that were available during my care of the patient were reviewed by me and considered in my medical decision making (see chart for details).    Benign exam.  There is no evidence of bacterial infection today.  Patient declines Rx for Paxlovid. Followup with Family Doctor if not improved in one week.   Final Clinical Impressions(s) / UC Diagnoses   Final diagnoses:  Contact with and (suspected) exposure to covid-19  Viral URI     Discharge Instructions      If cough develops,  take plain guaifenesin (1200mg  extended release tabs such as Mucinex) twice daily, with plenty of water, for cough and congestion.  May add Pseudoephedrine (30mg , one or two every 4 to 6 hours) for sinus congestion.  Get adequate rest.   May use Afrin nasal spray (or generic oxymetazoline) each morning for about 5 days and then discontinue.  Also recommend using saline nasal spray several times daily and saline nasal irrigation (AYR is a common brand).  Use Flonase nasal spray each morning after using Afrin nasal spray and saline nasal irrigation. Try warm salt water gargles for sore throat.  Stop all antihistamines for now, and other non-prescription cough/cold preparations. May take Ibuprofen 200mg , 4 tabs every 8 hours with food for body aches, headache, fever, etc. May take Delsym Cough Suppressant ("12 Hour Cough Relief") at bedtime for nighttime cough.   Your COVID-19 test is positive.  Isolate yourself for five days from today.  At the end of five days you may end isolation if your symptoms have cleared or improved, and you have not had a fever for 24 hours. At this time you should wear a mask for five more days when you are around others.   If symptoms become significantly worse during the night or over the weekend, proceed to the local emergency room.           ED Prescriptions   None       , MD 12/14/21 1911

## 2021-12-14 NOTE — Discharge Instructions (Signed)
If cough develops, take plain guaifenesin (1200mg  extended release tabs such as Mucinex) twice daily, with plenty of water, for cough and congestion.  May add Pseudoephedrine (30mg , one or two every 4 to 6 hours) for sinus congestion.  Get adequate rest.   May use Afrin nasal spray (or generic oxymetazoline) each morning for about 5 days and then discontinue.  Also recommend using saline nasal spray several times daily and saline nasal irrigation (AYR is a common brand).  Use Flonase nasal spray each morning after using Afrin nasal spray and saline nasal irrigation. Try warm salt water gargles for sore throat.  Stop all antihistamines for now, and other non-prescription cough/cold preparations. May take Ibuprofen 200mg , 4 tabs every 8 hours with food for body aches, headache, fever, etc. May take Delsym Cough Suppressant ("12 Hour Cough Relief") at bedtime for nighttime cough.   Your COVID-19 test is positive.  Isolate yourself for five days from today.  At the end of five days you may end isolation if your symptoms have cleared or improved, and you have not had a fever for 24 hours. At this time you should wear a mask for five more days when you are around others.   If symptoms become significantly worse during the night or over the weekend, proceed to the local emergency room.

## 2021-12-14 NOTE — ED Triage Notes (Addendum)
COVID positive today  Employer requires a PCR test result  Symptoms started on Thursday  Sinus drainage  Headache- out of Nurtec OTC cough & cold meds w/ tylenol in them No COVID  vaccine  COVID  in past  - can not recall date  - more  than a year ago  Daughter was sick earlier in the week

## 2021-12-15 ENCOUNTER — Telehealth: Payer: Self-pay | Admitting: Emergency Medicine

## 2021-12-15 LAB — RESP PANEL BY RT-PCR (FLU A&B, COVID) ARPGX2
Influenza A by PCR: NEGATIVE
Influenza B by PCR: NEGATIVE
SARS Coronavirus 2 by RT PCR: POSITIVE — AB

## 2021-12-15 MED ORDER — PAXLOVID (300/100) 20 X 150 MG & 10 X 100MG PO TBPK: ORAL_TABLET | ORAL | 0 refills | Status: DC

## 2021-12-15 NOTE — Telephone Encounter (Signed)
Patient was diagnosed with COVID yesterday.  She calls today with worsening symptoms.  Desires a prescription for Paxlovid.  She did refuse this yesterday.  I reviewed her medical record.  She has not had renal testing for some time, her GFR in 2018 was 133.  She does not have any medical problems that would predict worsening kidney function.  No other medicine interaction

## 2021-12-15 NOTE — Telephone Encounter (Signed)
Attempted to contact patient, unable to leave a message due to vm being full.

## 2021-12-15 NOTE — Telephone Encounter (Signed)
Call back from patient regarding continued sore throat pain. Erin Little asked if she could take anything else for it. Patient had declined paxlovid at the visit yesterday. Chart reviewed w/ Dr Delton See. Pt will stop voltaren now and switch to Ibuprofen 600mg   every 6 hours for pain. Pt would also like the prescription for Paxlovid sent in to her pharmacy  - [facility] [address] in [place]. Per Dr Te[place], Delton See can also try chloraseptic spray or lozenges for the throat pain.

## 2021-12-16 ENCOUNTER — Telehealth: Payer: Self-pay | Admitting: Family

## 2021-12-16 DIAGNOSIS — U071 COVID-19: Secondary | ICD-10-CM

## 2021-12-16 DIAGNOSIS — J029 Acute pharyngitis, unspecified: Secondary | ICD-10-CM

## 2021-12-16 MED ORDER — AMOXICILLIN 500 MG PO CAPS: 500.0000 mg | ORAL_CAPSULE | Freq: Two times a day (BID) | ORAL | 0 refills | Status: DC

## 2021-12-16 NOTE — Progress Notes (Signed)
Virtual Visit Consent   Erin Little, you are scheduled for a virtual visit with a [facility] provider today. Just as with appointments in the office, your consent must be obtained to participate. Your consent will be active for this visit and any virtual visit you may have with one of our providers in the next 365 days. If you have a MyChart account, a copy of this consent can be sent to you electronically.  As this is a virtual visit, video technology does not allow for your provider to perform a traditional examination. This may limit your provider's ability to fully assess your condition. If your provider identifies any concerns that need to be evaluated in person or the need to arrange testing (such as labs, EKG, etc.), we will make arrangements to do so. Although advances in technology are sophisticated, we cannot ensure that it will always work on either your end or our end. If the connection with a video visit is poor, the visit may have to be switched to a telephone visit. With either a video or telephone visit, we are not always able to ensure that we have a secure connection.  By engaging in this virtual visit, you consent to the provision of healthcare and authorize for your insurance to be billed (if applicable) for the services provided during this visit. Depending on your insurance coverage, you may receive a charge related to this service.  I need to obtain your verbal consent now. Are you willing to proceed with your visit today? Erin Little has provided verbal consent on 12/16/2021 for a virtual visit (video or telephone). Jannifer Rodney, FNP  Date: 12/16/2021 9:42 AM  Virtual Visit via Video Note   I, Jannifer Rodney, connected with  Erin Little  ([id number], 04-25-1981) on 12/16/21 at  9:30 AM EDT by a video-enabled telemedicine application and verified that I am speaking with the correct person using two identifiers.  Location: Patient: Virtual Visit Location Patient:  Home Provider: Virtual Visit Location Provider: Home Office   I discussed the limitations of evaluation and management by telemedicine and the availability of in person appointments. The patient expressed understanding and agreed to proceed.    History of Present Illness: Erin Little is a 41 y.o. who identifies as a female who was assigned female at birth, and is being seen today for sore throat 4 days ago. She was diagnosed with COVID on 12/14/21. She was given Paxlovid, but she did not start this. However, states her throat pain is worsening every day.   HPI: Sore Throat  This is a new problem. The current episode started in the past 7 days. The problem has been gradually worsening. There has been no fever. The pain is at a severity of 10/10. The pain is moderate. Associated symptoms include congestion, coughing (slight), ear pain, headaches, swollen glands and trouble swallowing. Pertinent negatives include no diarrhea or shortness of breath. She has tried acetaminophen, NSAIDs, gargles and cool liquids for the symptoms. The treatment provided mild relief.    Problems:  Patient Active Problem List   Diagnosis Date Noted   Lateral epicondylitis, right elbow 11/22/2021   Depression, major, single episode, mild (HCC) 10/01/2020   Plantar fasciitis, right 09/24/2019   Generalized anxiety disorder 06/28/2015   Insomnia 06/28/2015   IUD (intrauterine device) in place 06/28/2015   Recurrent herpes labialis 06/28/2015   ADD (attention deficit disorder) 10/13/2013   Chronic migraine without aura 10/13/2013   Interstitial cystitis (chronic) without hematuria 10/13/2013  Foot pain, bilateral 06/30/2012    Allergies:  Allergies  Allergen Reactions   Latex Itching and Rash    Other reaction(s): Flushing (ALLERGY/intolerance)   Sulfamethoxazole-Trimethoprim Hives and Itching   Cephalexin Nausea And Vomiting, Itching and Rash    Hives/vomiting    Nitrofurantoin Nausea And Vomiting,  Itching and Rash    Hives/vomiting    Medications:  Current Outpatient Medications:    amoxicillin (AMOXIL) 500 MG capsule, Take 1 capsule (500 mg total) by mouth 2 (two) times daily for 10 days., Disp: 20 capsule, Rfl: 0   diclofenac (VOLTAREN) 75 MG EC tablet, Take 1 tablet (75 mg total) by mouth 2 (two) times daily., Disp: 60 tablet, Rfl: 3   nirmatrelvir & ritonavir (PAXLOVID, 300/100,) 20 x 150 MG & 10 x 100MG  TBPK, Take as directed, Disp: 1 each, Rfl: 0  Observations/Objective: Patient is well-developed, well-nourished in no acute distress.  Resting comfortably  at home.  Head is normocephalic, atraumatic.  No labored breathing.  Speech is clear and coherent with logical content.  Patient is alert and oriented at baseline.  Crying, tonsils erythemas and swollen  Assessment and Plan: 1. COVID-19  2. Sore throat  3. Acute pharyngitis, unspecified etiology - amoxicillin (AMOXIL) 500 MG capsule; Take 1 capsule (500 mg total) by mouth 2 (two) times daily for 10 days.  Dispense: 20 capsule; Refill: 0  - Take meds as prescribed - Use a cool mist humidifier  -Use saline nose sprays frequently -Force fluids -For any cough or congestion  Use plain Mucinex- regular strength or max strength is fine -For fever or aces or pains- take tylenol or ibuprofen. -Throat lozenges if help -New toothbrush in 3 days -Recommend to start antivirals   Follow Up Instructions: I discussed the assessment and treatment plan with the patient. The patient was provided an opportunity to ask questions and all were answered. The patient agreed with the plan and demonstrated an understanding of the instructions.  A copy of instructions were sent to the patient via MyChart unless otherwise noted below.    The patient was advised to call back or seek an in-person evaluation if the symptoms worsen or if the condition fails to improve as anticipated.  Time:  I spent 12 minutes with the patient via  telehealth technology discussing the above problems/concerns.    , FNP

## 2021-12-16 NOTE — Patient Instructions (Signed)

## 2021-12-17 ENCOUNTER — Ambulatory Visit
Admission: EM | Admit: 2021-12-17 | Discharge: 2021-12-17 | Disposition: A | Discharge status: Home / Self Care | Payer: Managed Care, Other (non HMO)

## 2021-12-17 ENCOUNTER — Encounter (INDEPENDENT_AMBULATORY_CARE_PROVIDER_SITE_OTHER): Payer: Managed Care, Other (non HMO) | Admitting: Family Medicine

## 2021-12-17 ENCOUNTER — Encounter: Payer: Self-pay | Admitting: Emergency Medicine

## 2021-12-17 ENCOUNTER — Telehealth: Payer: Self-pay

## 2021-12-17 DIAGNOSIS — G43719 Chronic migraine without aura, intractable, without status migrainosus: Secondary | ICD-10-CM

## 2021-12-17 DIAGNOSIS — U071 COVID-19: Secondary | ICD-10-CM

## 2021-12-17 NOTE — Telephone Encounter (Signed)
Please see the MyChart message reply(ies) for my assessment and plan.    This patient gave consent for this Medical Advice Message and is aware that it may result in a bill to their insurance company, as well as the possibility of receiving a bill for a co-payment or deductible. They are an established patient, but are not seeking medical advice exclusively about a problem treated during an in person or video visit in the last seven days. I did not recommend an in person or video visit within seven days of my reply.    I spent a total of 11 minutes cumulative time within 7 days through MyChart messaging.  Lesly Joslyn, DO   

## 2021-12-17 NOTE — Telephone Encounter (Signed)
Spoke with pt advised her that Dr. Ashley Royalty will respond to her MyChart message and to be on the lookout for that email.  He will address all of her questions in his message.  I did advise pt re: CDC guidelines for COVID isolation and that as long as she has been fever free without antipyretics for at least 24 hours she may come have her lab work done anytime after Tuesday.  Pt expressed understanding.  Tiajuana Amass, CMA

## 2021-12-17 NOTE — Discharge Instructions (Addendum)
Advised/instructed patient to start Paxlovid as prescribed by her PCP take to completion and to self quarantine for the next 5 days until complete with Paxlovid.  Advised patient if symptoms worsen and/or unresolved please follow-up with PCP for further evaluation.

## 2021-12-17 NOTE — ED Provider Notes (Signed)
[name]    CSN: [id number] Arrival date & time: 12/17/21  1626      History   Chief Complaint Chief Complaint  Patient presents with   Shortness of Breath    HPI Erin Little is a 41 y.o. female.   HPI 41 year old female presents with shortness of breath for 2-3 days.  Patient tested positive for COVID-19 here on Friday, 12/14/2021.  Patient reports her PCP called in Amoxicillin and Paxlovid on Saturday, 12/15/2021.  Patient states" I have not taking the Paxlovid because it will not help with my COVID-19 I have read reviews on Paxlovid and do not feel comfortable taking it.  I have taken 1 dose of amoxicillin and my throat feels a lot better. " Patient reports main reason she is here for chest x-ray.  PMH significant for obesity, GAD, and chronic migraine.  History reviewed. No pertinent past medical history.  Patient Active Problem List   Diagnosis Date Noted   Lateral epicondylitis, right elbow 11/22/2021   Depression, major, single episode, mild (HCC) 10/01/2020   Plantar fasciitis, right 09/24/2019   Generalized anxiety disorder 06/28/2015   Insomnia 06/28/2015   IUD (intrauterine device) in place 06/28/2015   Recurrent herpes labialis 06/28/2015   ADD (attention deficit disorder) 10/13/2013   Chronic migraine without aura 10/13/2013   Interstitial cystitis (chronic) without hematuria 10/13/2013   Foot pain, bilateral 06/30/2012    Past Surgical History:  Procedure Laterality Date   APPENDECTOMY     ELBOW ARTHROSCOPY     TONSILLECTOMY      OB History   No obstetric history on file.      Home Medications    Prior to Admission medications   Medication Sig Start Date End Date Taking? Authorizing Provider  amoxicillin (AMOXIL) 500 MG capsule Take 1 capsule (500 mg total) by mouth 2 (two) times daily for 10 days. 12/16/21 12/26/21 Yes Hawks, Christy A, FNP  diclofenac (VOLTAREN) 75 MG EC tablet Take 1 tablet (75 mg total) by mouth 2 (two) times  daily. 11/22/21 11/22/22 Yes Monica Becton, MD  nirmatrelvir & ritonavir (PAXLOVID, 300/100,) 20 x 150 MG & 10 x 100MG  TBPK Take as directed 12/15/21  Yes 12/17/21, MD    Family History History reviewed. No pertinent family history.  Social History Social History   Tobacco Use   Smoking status: Never   Smokeless tobacco: Never  Vaping Use   Vaping Use: Never used  Substance Use Topics   Alcohol use: Not Currently   Drug use: Not Currently     Allergies   Latex, Sulfamethoxazole-trimethoprim, Cephalexin, and Nitrofurantoin   Review of Systems Review of Systems  Respiratory:  Positive for shortness of breath.   All other systems reviewed and are negative.    Physical Exam Triage Vital Signs ED Triage Vitals  Enc Vitals Group     BP 12/17/21 1646 137/81     Pulse Rate 12/17/21 1646 86     Resp 12/17/21 1646 20     Temp 12/17/21 1646 98.8 F (37.1 C)     Temp Source 12/17/21 1646 Oral     SpO2 12/17/21 1646 96 %     Weight --      Height --      Head Circumference --      Peak Flow --      Pain Score 12/17/21 1648 0     Pain Loc --      Pain Edu? --  Excl. in GC? --    No data found.  Updated Vital Signs BP 137/81 (BP Location: Left Arm)   Pulse 86   Temp 98.8 F (37.1 C) (Oral)   Resp 20   LMP 11/19/2021 (Approximate)   SpO2 96%   Physical Exam Vitals and nursing note reviewed.  Constitutional:      General: She is not in acute distress.    Appearance: Normal appearance. She is obese. She is not ill-appearing.  HENT:     Head: Normocephalic and atraumatic.     Right Ear: Tympanic membrane, ear canal and external ear normal.     Left Ear: Tympanic membrane, ear canal and external ear normal.     Mouth/Throat:     Mouth: Mucous membranes are moist.     Pharynx: Oropharynx is clear.  Eyes:     Extraocular Movements: Extraocular movements intact.     Conjunctiva/sclera: Conjunctivae normal.     Pupils: Pupils are equal, round,  and reactive to light.  Cardiovascular:     Rate and Rhythm: Normal rate and regular rhythm.     Pulses: Normal pulses.     Heart sounds: Normal heart sounds.  Pulmonary:     Effort: Pulmonary effort is normal.     Breath sounds: Normal breath sounds. No wheezing, rhonchi or rales.  Musculoskeletal:        General: Normal range of motion.     Cervical back: Normal range of motion and neck supple.  Skin:    General: Skin is warm and dry.  Neurological:     General: No focal deficit present.     Mental Status: She is alert and oriented to person, place, and time. Mental status is at baseline.      UC Treatments / Results  Labs (all labs ordered are listed, but only abnormal results are displayed) Labs Reviewed - No data to display  EKG   Radiology No results found.  Procedures Procedures (including critical care time)  Medications Ordered in UC Medications - No data to display  Initial Impression / Assessment and Plan / UC Course  I have reviewed the triage vital signs and the nursing notes.  Pertinent labs & imaging results that were available during my care of the patient were reviewed by me and considered in my medical decision making (see chart for details).     MDM: 1.  COVID-19-Advised patient to follow-up with her PCP for further evaluation and to discuss taking previously prescribed Paxlovid and amoxicillin. Advised/instructed patient to start Paxlovid as prescribed by her PCP take to completion and to self quarantine for the next 5 days until complete with Paxlovid.  Advised patient if symptoms worsen and/or unresolved please follow-up with PCP for further evaluation.  Patient discharged home, hemodynamically stable. Final Clinical Impressions(s) / UC Diagnoses   Final diagnoses:  COVID-19     Discharge Instructions      Advised/instructed patient to start Paxlovid as prescribed by her PCP take to completion and to self quarantine for the next 5 days  until complete with Paxlovid.  Advised patient if symptoms worsen and/or unresolved please follow-up with PCP for further evaluation.     ED Prescriptions   None    PDMP not reviewed this encounter.   Trevor Iha, FNP 12/17/21 1724

## 2021-12-17 NOTE — ED Triage Notes (Signed)
Patient tested positive for COVID on Friday and now she is complaining of SOB.  The SOB worsened yesterday.

## 2021-12-17 NOTE — Telephone Encounter (Signed)
Pt called stating that she had some questions and requested a call back.  However, pt also sent MyChart message with many questions, which was forwarded to Dr. Ashley Royalty.  Attempted to contact pt to advise her that we received her MyChart message and are awaiting response from Dr. Ashley Royalty, but there was no answer and VM box was full.  Unable to leave message.  Tiajuana Amass, CMA

## 2021-12-19 ENCOUNTER — Other Ambulatory Visit: Payer: Self-pay | Admitting: Family Medicine

## 2021-12-20 LAB — CBC
HCT: 42.6 % (ref 35.0–45.0)
Hemoglobin: 14.3 g/dL (ref 11.7–15.5)
MCH: 28.4 pg (ref 27.0–33.0)
MCHC: 33.6 g/dL (ref 32.0–36.0)
MCV: 84.7 fL (ref 80.0–100.0)
MPV: 10.2 fL (ref 7.5–12.5)
Platelets: 226 10*3/uL (ref 140–400)
RBC: 5.03 10*6/uL (ref 3.80–5.10)
RDW: 13.2 % (ref 11.0–15.0)
WBC: 4.2 10*3/uL (ref 3.8–10.8)

## 2021-12-20 LAB — COMPREHENSIVE METABOLIC PANEL
AG Ratio: 1.4 (calc) (ref 1.0–2.5)
ALT: 14 U/L (ref 6–29)
AST: 16 U/L (ref 10–30)
Albumin: 4.1 g/dL (ref 3.6–5.1)
Alkaline phosphatase (APISO): 73 U/L (ref 31–125)
BUN: 8 mg/dL (ref 7–25)
CO2: 26 mmol/L (ref 20–32)
Calcium: 9.3 mg/dL (ref 8.6–10.2)
Chloride: 106 mmol/L (ref 98–110)
Creat: 0.58 mg/dL (ref 0.50–0.99)
Globulin: 2.9 g/dL (calc) (ref 1.9–3.7)
Glucose, Bld: 79 mg/dL (ref 65–99)
Potassium: 4.2 mmol/L (ref 3.5–5.3)
Sodium: 141 mmol/L (ref 135–146)
Total Bilirubin: 0.5 mg/dL (ref 0.2–1.2)
Total Protein: 7 g/dL (ref 6.1–8.1)

## 2021-12-20 LAB — LIPID PANEL
Cholesterol: 148 mg/dL (ref <200)
HDL: 44 mg/dL — ABNORMAL LOW (ref >=50)
LDL Cholesterol (Calc): 79 mg/dL (calc)
Non-HDL Cholesterol (Calc): 104 mg/dL (calc) (ref <130)
Total CHOL/HDL Ratio: 3.4 (calc) (ref <5.0)
Triglycerides: 149 mg/dL (ref <150)

## 2021-12-21 ENCOUNTER — Telehealth: Payer: Self-pay

## 2021-12-21 NOTE — Telephone Encounter (Signed)
Pt lvm stating headache/migraine upon waking. Requesting Nutec refill. She already has an appointment on Tuesday but would like meds until the appt.

## 2021-12-25 ENCOUNTER — Ambulatory Visit (INDEPENDENT_AMBULATORY_CARE_PROVIDER_SITE_OTHER): Payer: Managed Care, Other (non HMO) | Admitting: Family Medicine

## 2021-12-25 ENCOUNTER — Encounter: Payer: Self-pay | Admitting: Family Medicine

## 2021-12-25 VITALS — BP 109/71 | HR 75 | Ht 64.0 in | Wt 220.0 lb

## 2021-12-25 DIAGNOSIS — G43719 Chronic migraine without aura, intractable, without status migrainosus: Secondary | ICD-10-CM

## 2021-12-25 DIAGNOSIS — Z0000 Encounter for general adult medical examination without abnormal findings: Secondary | ICD-10-CM | POA: Diagnosis not present

## 2021-12-25 DIAGNOSIS — B001 Herpesviral vesicular dermatitis: Secondary | ICD-10-CM

## 2021-12-25 DIAGNOSIS — F988 Other specified behavioral and emotional disorders with onset usually occurring in childhood and adolescence: Secondary | ICD-10-CM

## 2021-12-25 DIAGNOSIS — F411 Generalized anxiety disorder: Secondary | ICD-10-CM | POA: Diagnosis not present

## 2021-12-25 MED ORDER — NURTEC 75 MG PO TBDP: 1.0000 | ORAL_TABLET | Freq: Every day | ORAL | 2 refills | Status: DC | PRN

## 2021-12-25 MED ORDER — VALACYCLOVIR HCL 500 MG PO TABS
500.0000 mg | ORAL_TABLET | Freq: Every day | ORAL | 3 refills | Status: DC
Start: 2021-12-25 — End: 2023-11-21

## 2021-12-25 MED ORDER — AMPHETAMINE-DEXTROAMPHETAMINE 15 MG PO TABS: 15.0000 mg | ORAL_TABLET | Freq: Two times a day (BID) | ORAL | 0 refills | Status: DC

## 2021-12-25 MED ORDER — TIZANIDINE HCL 4 MG PO CAPS: 4.0000 mg | ORAL_CAPSULE | Freq: Three times a day (TID) | ORAL | 3 refills | Status: DC | PRN

## 2021-12-25 NOTE — Assessment & Plan Note (Signed)
She has had increased episodes of cold sores.  Adding Valtrex daily for suppression.

## 2021-12-25 NOTE — Assessment & Plan Note (Signed)
Well adult Recent lab results reviewed with her. Screenings: She will have updated Pap and mammogram completed at GYN. Immunizations: Up-to-date Anticipatory guidance/risk factor reduction: Recommendations per AVS.

## 2021-12-25 NOTE — Assessment & Plan Note (Signed)
She has had difficulty finding Adderall XR 20 mg in stock.  We will switch to Adderall 15 mg twice daily.  She will let me know if this is not working well for her.

## 2021-12-25 NOTE — Progress Notes (Signed)
Erin Little - 41 y.o. female MRN [id number]  Date of birth: [birth year]  Subjective Chief Complaint  Patient presents with   Annual Exam    HPI Erin Little is a 41 year old female here today for annual exam.   She did lose her insurance recently and has been off of several of her medications.  Recently ran out of Nurtec.  She reports this is working very well for her to manage her migraines previously.  She did not have any side effects from this medication.  She does also have tension type headaches.  She uses Zanaflex as needed for this.  Additionally she has noted some increased difficulty with attention span and completion of tasks at work once again.  She was diagnosed with ADHD previously.  She had been treated with Adderall XR 20 mg daily however has not taken this in a few months.  She did well with this previously.  She did not have side effects with prior dosing.  She has noted some increased anxiety.  She would be interested in seeing a therapist.  She has had increased outbreaks of cold sores over the past several months.  She is having 1-2 outbreaks per month.  Needs renewal of Valtrex.  She does exercise occasionally.  Feels like diet is fairly good.  She sees GYN for Pap smear.  She is due for mammogram.  Plans to have this ordered through her GYN.  She is a non-smoker.  Review of Systems  Constitutional:  Negative for chills, fever, malaise/fatigue and weight loss.  HENT:  Negative for congestion, ear pain and sore throat.   Eyes:  Negative for blurred vision, double vision and pain.  Respiratory:  Negative for cough and shortness of breath.   Cardiovascular:  Negative for chest pain and palpitations.  Gastrointestinal:  Negative for abdominal pain, blood in stool, constipation, heartburn and nausea.  Genitourinary:  Negative for dysuria and urgency.  Musculoskeletal:  Negative for joint pain and myalgias.  Neurological:  Negative for dizziness and headaches.   Endo/Heme/Allergies:  Does not bruise/bleed easily.  Psychiatric/Behavioral:  Negative for depression. The patient is not nervous/anxious and does not have insomnia.        Allergies  Allergen Reactions   Latex Itching and Rash    Other reaction(s): Flushing (ALLERGY/intolerance)   Sulfamethoxazole-Trimethoprim Hives and Itching   Cephalexin Nausea And Vomiting, Itching and Rash    Hives/vomiting    Nitrofurantoin Nausea And Vomiting, Itching and Rash    Hives/vomiting     History reviewed. No pertinent past medical history.  Past Surgical History:  Procedure Laterality Date   APPENDECTOMY     ELBOW ARTHROSCOPY     TONSILLECTOMY      Social History   Socioeconomic History   Marital status: Divorced    Spouse name: Not on file   Number of children: 2   Years of education: Not on file   Highest education level: Not on file  Occupational History   Not on file  Tobacco Use   Smoking status: Never   Smokeless tobacco: Never  Vaping Use   Vaping Use: Never used  Substance and Sexual Activity   Alcohol use: Not Currently   Drug use: Not Currently   Sexual activity: Yes    Birth control/protection: I.U.D.  Other Topics Concern   Not on file  Social History Narrative   Not on file   Social Determinants of Health   Financial Resource Strain: Not on file  Food Insecurity: Not  on file  Transportation Needs: Not on file  Physical Activity: Not on file  Stress: Not on file  Social Connections: Not on file    History reviewed. No pertinent family history.  Health Maintenance  Topic Date Due   PAP SMEAR-Modifier  05/02/2022 (Originally 04/02/2000)   COVID-19 Vaccine (1) 05/02/2022 (Originally [birth year]1979)   INFLUENZA VACCINE  07/28/2022 (Originally 11/27/2021)   Hepatitis C Screening  12/26/2022 (Originally 04/02/1997)   HIV Screening  12/26/2022 (Originally 04/02/1994)   TETANUS/TDAP  05/21/2023   HPV VACCINES  Aged Out      ----------------------------------------------------------------------------------------------------------------------------------------------------------------------------------------------------------------- Physical Exam BP 109/71 (BP Location: Left Arm, Patient Position: Sitting, Cuff Size: Large)   Pulse 75   Ht 5\' 4"  (1.626 m)   Wt 220 lb (99.8 kg)   LMP 11/19/2021 (Approximate)   SpO2 96%   BMI 37.76 kg/m   Physical Exam Constitutional:      General: She is not in acute distress. HENT:     Head: Normocephalic and atraumatic.     Right Ear: Tympanic membrane and ear canal normal.     Left Ear: Tympanic membrane and ear canal normal.     Nose: Nose normal.  Eyes:     General: No scleral icterus.    Conjunctiva/sclera: Conjunctivae normal.  Neck:     Thyroid: No thyromegaly.  Cardiovascular:     Rate and Rhythm: Normal rate and regular rhythm.     Heart sounds: Normal heart sounds.  Pulmonary:     Effort: Pulmonary effort is normal.     Breath sounds: Normal breath sounds.  Abdominal:     General: Bowel sounds are normal. There is no distension.     Palpations: Abdomen is soft.     Tenderness: There is no abdominal tenderness. There is no guarding.  Musculoskeletal:        General: Normal range of motion.     Cervical back: Normal range of motion and neck supple.  Lymphadenopathy:     Cervical: No cervical adenopathy.  Skin:    General: Skin is warm and dry.     Findings: No rash.  Neurological:     General: No focal deficit present.     Mental Status: She is alert and oriented to person, place, and time.     Cranial Nerves: No cranial nerve deficit.     Coordination: Coordination normal.  Psychiatric:        Mood and Affect: Mood normal.        Behavior: Behavior normal.      ------------------------------------------------------------------------------------------------------------------------------------------------------------------------------------------------------------------- Assessment and Plan  Recurrent herpes labialis She has had increased episodes of cold sores.  Adding Valtrex daily for suppression.  Chronic migraine without aura She has combination of tension type and migraines.  Continue Zanaflex as needed for tension type headaches.  Nurtec is worked very well for her for her migraines.  We will plan to continue this.  Generalized anxiety disorder Referral placed for therapist.  ADD (attention deficit disorder) She has had difficulty finding Adderall XR 20 mg in stock.  We will switch to Adderall 15 mg twice daily.  She will let me know if this is not working well for her.   Meds ordered this encounter  Medications   amphetamine-dextroamphetamine (ADDERALL) 15 MG tablet    Sig: Take 1 tablet by mouth 2 (two) times daily.    Dispense:  60 tablet    Refill:  0   Rimegepant Sulfate (NURTEC) 75 MG TBDP    Sig: Take  1 tablet by mouth daily as needed (headache).    Dispense:  10 tablet    Refill:  2   tiZANidine (ZANAFLEX) 4 MG capsule    Sig: Take 1 capsule (4 mg total) by mouth 3 (three) times daily as needed for muscle spasms.    Dispense:  90 capsule    Refill:  3   valACYclovir (VALTREX) 500 MG tablet    Sig: Take 1 tablet (500 mg total) by mouth daily. Take for 5 days as needed for cold sore    Dispense:  90 tablet    Refill:  3    Return in about 6 weeks (around 02/05/2022) for ADHD/Migraines.    This visit occurred during the SARS-CoV-2 public health emergency.  Safety protocols were in place, including screening questions prior to the visit, additional usage of staff PPE, and extensive cleaning of exam room while observing appropriate contact time as indicated for disinfecting solutions.

## 2021-12-25 NOTE — Assessment & Plan Note (Signed)
She has combination of tension type and migraines.  Continue Zanaflex as needed for tension type headaches.  Nurtec is worked very well for her for her migraines.  We will plan to continue this.

## 2021-12-25 NOTE — Patient Instructions (Addendum)
  Preventive Care 40-43 Years Old, Female Preventive care refers to lifestyle choices and visits with your health care provider that can promote health and wellness. Preventive care visits are also called wellness exams. What can I expect for my preventive care visit? Counseling Your health care provider may ask you questions about your: Medical history, including: Past medical problems. Family medical history. Pregnancy history. Current health, including: Menstrual cycle. Method of birth control. Emotional well-being. Home life and relationship well-being. Sexual activity and sexual health. Lifestyle, including: Alcohol, nicotine or tobacco, and drug use. Access to firearms. Diet, exercise, and sleep habits. Work and work environment. Sunscreen use. Safety issues such as seatbelt and bike helmet use. Physical exam Your health care provider will check your: Height and weight. These may be used to calculate your BMI (body mass index). BMI is a measurement that tells if you are at a healthy weight. Waist circumference. This measures the distance around your waistline. This measurement also tells if you are at a healthy weight and may help predict your risk of certain diseases, such as type 2 diabetes and high blood pressure. Heart rate and blood pressure. Body temperature. Skin for abnormal spots. What immunizations do I need?  Vaccines are usually given at various ages, according to a schedule. Your health care provider will recommend vaccines for you based on your age, medical history, and lifestyle or other factors, such as travel or where you work. What tests do I need? Screening Your health care provider may recommend screening tests for certain conditions. This may include: Lipid and cholesterol levels. Diabetes screening. This is done by checking your blood sugar (glucose) after you have not eaten for a while (fasting). Pelvic exam and Pap test. Hepatitis B  test. Hepatitis C test. HIV (human immunodeficiency virus) test. STI (sexually transmitted infection) testing, if you are at risk. Lung cancer screening. Colorectal cancer screening. Mammogram. Talk with your health care provider about when you should start having regular mammograms. This may depend on whether you have a family history of breast cancer. BRCA-related cancer screening. This may be done if you have a family history of breast, ovarian, tubal, or peritoneal cancers. Bone density scan. This is done to screen for osteoporosis. Talk with your health care provider about your test results, treatment options, and if necessary, the need for more tests. Follow these instructions at home: Eating and drinking  Eat a diet that includes fresh fruits and vegetables, whole grains, lean protein, and low-fat dairy products. Take vitamin and mineral supplements as recommended by your health care provider. Do not drink alcohol if: Your health care provider tells you not to drink. You are pregnant, may be pregnant, or are planning to become pregnant. If you drink alcohol: Limit how much you have to 0-1 drink a day. Know how much alcohol is in your drink. In the [place] one drink equals one 12 oz bottle of beer (355 mL), one 5 oz glass of wine (148 mL), or one 1 oz glass of hard liquor (44 mL). Lifestyle Brush your teeth every morning and night with fluoride toothpaste. Floss one time each day. Exercise for at least 30 minutes 5 or more days each week. Do not use any products that contain nicotine or tobacco. These products include cigarettes, chewing tobacco, and vaping devices, such as e-cigarettes. If you need help quitting, ask your health care provider. Do not use drugs. If you are sexually active, practice safe sex. Use a condom or other form of   protection to prevent STIs. If you do not wish to become pregnant, use a form of birth control. If you plan to become pregnant, see your health care  provider for a prepregnancy visit. Take aspirin only as told by your health care provider. Make sure that you understand how much to take and what form to take. Work with your health care provider to find out whether it is safe and beneficial for you to take aspirin daily. Find healthy ways to manage stress, such as: Meditation, yoga, or listening to music. Journaling. Talking to a trusted person. Spending time with friends and family. Minimize exposure to UV radiation to reduce your risk of skin cancer. Safety Always wear your seat belt while driving or riding in a vehicle. Do not drive: If you have been drinking alcohol. Do not ride with someone who has been drinking. When you are tired or distracted. While texting. If you have been using any mind-altering substances or drugs. Wear a helmet and other protective equipment during sports activities. If you have firearms in your house, make sure you follow all gun safety procedures. Seek help if you have been physically or sexually abused. What's next? Visit your health care provider once a year for an annual wellness visit. Ask your health care provider how often you should have your eyes and teeth checked. Stay up to date on all vaccines. This information is not intended to replace advice given to you by your health care provider. Make sure you discuss any questions you have with your health care provider. Document Revised: 10/11/2020 Document Reviewed: 10/11/2020 Elsevier Patient Education  2023 [employer]  

## 2021-12-25 NOTE — Assessment & Plan Note (Signed)
Referral placed for therapist.

## 2021-12-27 ENCOUNTER — Encounter: Payer: Self-pay | Admitting: Family Medicine

## 2022-01-03 ENCOUNTER — Ambulatory Visit: Payer: Managed Care, Other (non HMO) | Admitting: Sports Medicine

## 2022-01-03 MED ORDER — RIZATRIPTAN BENZOATE 10 MG PO TBDP: 10.0000 mg | ORAL_TABLET | ORAL | 0 refills | Status: DC | PRN

## 2022-01-03 NOTE — Telephone Encounter (Signed)
Maxalt sent in.

## 2022-01-09 NOTE — Telephone Encounter (Signed)
Spoke with pharmacy who states that the pt picked up this medication on the same day that she sent the MyChart message.  No further action needed.  Encounter closed.  Tiajuana Amass, CMA

## 2022-02-03 ENCOUNTER — Other Ambulatory Visit: Payer: Self-pay | Admitting: Family Medicine

## 2022-02-12 ENCOUNTER — Ambulatory Visit (INDEPENDENT_AMBULATORY_CARE_PROVIDER_SITE_OTHER): Payer: Managed Care, Other (non HMO) | Admitting: Family Medicine

## 2022-02-12 ENCOUNTER — Encounter: Payer: Self-pay | Admitting: Family Medicine

## 2022-02-12 DIAGNOSIS — F988 Other specified behavioral and emotional disorders with onset usually occurring in childhood and adolescence: Secondary | ICD-10-CM | POA: Diagnosis not present

## 2022-02-12 DIAGNOSIS — G43719 Chronic migraine without aura, intractable, without status migrainosus: Secondary | ICD-10-CM | POA: Diagnosis not present

## 2022-02-12 MED ORDER — AMPHETAMINE-DEXTROAMPHET ER 20 MG PO CP24: 20.0000 mg | ORAL_CAPSULE | ORAL | 0 refills | Status: DC

## 2022-02-12 MED ORDER — NURTEC 75 MG PO TBDP: 1.0000 | ORAL_TABLET | ORAL | 2 refills | Status: DC

## 2022-02-12 NOTE — Progress Notes (Signed)
Erin Little - 41 y.o. female MRN [id number]  Date of birth: 06-Jan-1981  Subjective Chief Complaint  Patient presents with   Migraine    Pt states  migraines seem worse around menstruation. She has not had a migraine for the past few days but has had neck tension which usually comes before the migraine    ADHD    Pt  states that  medication works in daytime - but if takes 2nd dose usually causes  a migraine or difficulty sleeping -  and occasionally forgets second dose     HPI Erin Little is a 41 year old female here today for follow-up visit.  Reports he is doing fairly well.  Continues to have some migraines.  Nurtec was finally improved and this is worked pretty well for her.  She is having some increased frequency and is curious about taking this for prophylactic dosing.  The majority of her migraines occur on her menstrual cycle.  ADHD is currently treated with Adderall 15 mg IR twice daily.  She reports she often forgets to take the afternoon dose.  Symptoms finds that afternoon dose does cause migraines.  Previously on Adderall XR and did quite well with this.  Later changed due to stock issues with extended release.  She would prefer to switch back to this.  ROS:  A comprehensive ROS was completed and negative except as noted per HPI  Allergies  Allergen Reactions   Latex Itching and Rash    Other reaction(s): Flushing (ALLERGY/intolerance)   Sulfamethoxazole-Trimethoprim Hives and Itching   Cephalexin Nausea And Vomiting, Itching and Rash    Hives/vomiting    Nitrofurantoin Nausea And Vomiting, Itching and Rash    Hives/vomiting     History reviewed. No pertinent past medical history.  Past Surgical History:  Procedure Laterality Date   APPENDECTOMY     ELBOW ARTHROSCOPY     TONSILLECTOMY      Social History   Socioeconomic History   Marital status: Divorced    Spouse name: Not on file   Number of children: 2   Years of education: Not on file   Highest education  level: Not on file  Occupational History   Not on file  Tobacco Use   Smoking status: Never   Smokeless tobacco: Never  Vaping Use   Vaping Use: Never used  Substance and Sexual Activity   Alcohol use: Not Currently   Drug use: Not Currently   Sexual activity: Yes    Birth control/protection: I.U.D.  Other Topics Concern   Not on file  Social History Narrative   Not on file   Social Determinants of Health   Financial Resource Strain: Not on file  Food Insecurity: Not on file  Transportation Needs: Not on file  Physical Activity: Not on file  Stress: Not on file  Social Connections: Not on file    History reviewed. No pertinent family history.  Health Maintenance  Topic Date Due   PAP SMEAR-Modifier  05/02/2022 (Originally 04/02/2000)   COVID-19 Vaccine (1) 05/02/2022 (Originally 10/02/1981)   INFLUENZA VACCINE  07/28/2022 (Originally 11/27/2021)   Hepatitis C Screening  12/26/2022 (Originally 04/02/1997)   HIV Screening  12/26/2022 (Originally 04/02/1994)   TETANUS/TDAP  05/21/2023   HPV VACCINES  Aged Out     ----------------------------------------------------------------------------------------------------------------------------------------------------------------------------------------------------------------- Physical Exam BP 112/74   Pulse 79   Ht 5\' 4"  (1.626 m)   Wt 213 lb (96.6 kg)   SpO2 99%   BMI 36.56 kg/m   Physical Exam Constitutional:  Appearance: Normal appearance.  Eyes:     General: No scleral icterus. Cardiovascular:     Rate and Rhythm: Normal rate and regular rhythm.  Pulmonary:     Effort: Pulmonary effort is normal.     Breath sounds: Normal breath sounds.  Musculoskeletal:     Cervical back: Neck supple.  Neurological:     Mental Status: She is alert.  Psychiatric:        Mood and Affect: Mood normal.        Behavior: Behavior normal.      ------------------------------------------------------------------------------------------------------------------------------------------------------------------------------------------------------------------- Assessment and Plan  Chronic migraine without aura She has had increased frequency of migraines.  Changing Nurtec to every other day for prophylactic dosing.  ADD (attention deficit disorder) Switching back to Adderall XR 20 mg daily.  Follow-up in 3 months.   Meds ordered this encounter  Medications   Rimegepant Sulfate (NURTEC) 75 MG TBDP    Sig: Take 1 tablet by mouth every other day. Take for migraine prevention    Dispense:  16 tablet    Refill:  2   amphetamine-dextroamphetamine (ADDERALL XR) 20 MG 24 hr capsule    Sig: Take 1 capsule (20 mg total) by mouth every morning.    Dispense:  30 capsule    Refill:  0    Return in about 3 months (around 05/15/2022) for ADHD.    This visit occurred during the SARS-CoV-2 public health emergency.  Safety protocols were in place, including screening questions prior to the visit, additional usage of staff PPE, and extensive cleaning of exam room while observing appropriate contact time as indicated for disinfecting solutions.

## 2022-02-12 NOTE — Assessment & Plan Note (Signed)
Switching back to Adderall XR 20 mg daily.  Follow-up in 3 months.

## 2022-02-12 NOTE — Patient Instructions (Addendum)
Let's try the adderall xr at 20mg  daily to replace the adderall 15mg .  Updated nurtec sent in.   See me again in 3 months.

## 2022-02-12 NOTE — Assessment & Plan Note (Signed)
She has had increased frequency of migraines.  Changing Nurtec to every other day for prophylactic dosing.

## 2022-03-25 ENCOUNTER — Encounter: Payer: Self-pay | Admitting: Obstetrics & Gynecology

## 2022-03-25 ENCOUNTER — Other Ambulatory Visit (HOSPITAL_COMMUNITY)
Admission: RE | Admit: 2022-03-25 | Discharge: 2022-03-25 | Disposition: A | Discharge status: Home / Self Care | Payer: Managed Care, Other (non HMO) | Source: Ambulatory Visit | Attending: Obstetrics & Gynecology | Admitting: Obstetrics & Gynecology

## 2022-03-25 ENCOUNTER — Ambulatory Visit: Payer: Managed Care, Other (non HMO) | Admitting: Obstetrics & Gynecology

## 2022-03-25 VITALS — BP 138/77 | HR 67 | Ht 65.0 in | Wt 209.0 lb

## 2022-03-25 DIAGNOSIS — Z01419 Encounter for gynecological examination (general) (routine) without abnormal findings: Secondary | ICD-10-CM | POA: Diagnosis present

## 2022-03-25 DIAGNOSIS — G8929 Other chronic pain: Secondary | ICD-10-CM

## 2022-03-25 NOTE — Progress Notes (Signed)
Subjective:     Erin Little is a 41 y.o. female here for a routine exam.  Current complaints: migraines. Has regular  menses and not sexually active.     Gynecologic History Patient's last menstrual period was 03/20/2022 (approximate). Contraception: abstinence Last Mammogram: Never done Last Pap Smear:  2018 Last Colon Screening;  never done     Seat Belts:   yes Sun Screen:   yes Dental Check Up:  yes Brush & Floss:  yes   Obstetric History OB History  Gravida Para Term Preterm AB Living  2 2 2     2   SAB IAB Ectopic Multiple Live Births               # Outcome Date GA Lbr Len/2nd Weight Sex Delivery Anes PTL Lv  2 Term      Vag-Spont     1 Term      Vag-Spont        The following portions of the patient's history were reviewed and updated as appropriate: allergies, current medications, past family history, past medical history, past social history, past surgical history, and problem list.  Review of Systems Pertinent items noted in HPI and remainder of comprehensive ROS otherwise negative.    Objective:     Vitals:   03/25/22 0841  BP: (!) 142/95  Pulse: 74  Weight: 209 lb (94.8 kg)  Height: 5\' 5"  (1.651 m)   Vitals:  WNL General appearance: alert, cooperative and no distress  HEENT: Normocephalic, without obvious abnormality, atraumatic Eyes: negative Throat: lips, mucosa, and tongue normal; teeth and gums normal  Respiratory: Clear to auscultation bilaterally  CV: Regular rate and rhythm  Breasts:  Normal appearance, no masses or tenderness, no nipple retraction or dimpling  GI: Soft, non-tender; bowel sounds normal; no masses,  no organomegaly  GU: External Genitalia:  Tanner V, no lesion Urethra:  No prolapse   Vagina: Pink, normal rugae, no blood or discharge  Cervix: No CMT, no lesion  Uterus:  Normal size and contour, non tender  Adnexa: Normal, no masses, non tender  Musculoskeletal: No edema, redness or tenderness in the calves or thighs   Skin: No lesions or rash  Lymphatic: Axillary adenopathy: none     Psychiatric: Normal mood and behavior   Vitals:   03/25/22 0841 03/25/22 0941  BP: (!) 142/95 138/77  Pulse: 74 67  Weight: 209 lb (94.8 kg)   Height: 5\' 5"  (1.651 m)         Assessment:    Healthy female exam.    Plan:   Pap with co testing Mammogram Referral to PT for Headaches / dry needling Continue daily valtrex for suppression (mouth) Colon cancer screening at 45 Reviewed signs and sympomts of ovarian cancer and need for evaluation for pelvic pain.  Repeat blood pressure after the exam and at rest, 138/77.  She has primary care physician and can address any hypertension issues in the future.

## 2022-03-25 NOTE — Progress Notes (Signed)
Last Mammogram: Never done Last Pap Smear:  2018 Last Colon Screening;  never done  Seat Belts:   yes Sun Screen:   yes Dental Check Up:  yes Brush & Floss:  yes

## 2022-03-27 ENCOUNTER — Other Ambulatory Visit: Payer: Self-pay

## 2022-03-27 ENCOUNTER — Other Ambulatory Visit: Payer: Self-pay | Admitting: Family Medicine

## 2022-03-27 ENCOUNTER — Encounter: Payer: Self-pay | Admitting: Family Medicine

## 2022-03-27 LAB — CYTOLOGY - PAP
Comment: NEGATIVE
Diagnosis: NEGATIVE
Diagnosis: REACTIVE
High risk HPV: NEGATIVE

## 2022-03-27 MED ORDER — AMPHETAMINE-DEXTROAMPHET ER 20 MG PO CP24: 20.0000 mg | ORAL_CAPSULE | ORAL | 0 refills | Status: DC

## 2022-03-28 ENCOUNTER — Ambulatory Visit (INDEPENDENT_AMBULATORY_CARE_PROVIDER_SITE_OTHER): Payer: Managed Care, Other (non HMO)

## 2022-03-28 DIAGNOSIS — Z01419 Encounter for gynecological examination (general) (routine) without abnormal findings: Secondary | ICD-10-CM

## 2022-03-28 MED ORDER — RIZATRIPTAN BENZOATE 10 MG PO TBDP: 10.0000 mg | ORAL_TABLET | ORAL | 0 refills | Status: DC | PRN

## 2022-04-01 ENCOUNTER — Other Ambulatory Visit: Payer: Self-pay | Admitting: Obstetrics & Gynecology

## 2022-04-01 DIAGNOSIS — R928 Other abnormal and inconclusive findings on diagnostic imaging of breast: Secondary | ICD-10-CM

## 2022-04-04 ENCOUNTER — Ambulatory Visit: Payer: Managed Care, Other (non HMO)

## 2022-04-04 ENCOUNTER — Ambulatory Visit
Admission: RE | Admit: 2022-04-04 | Discharge: 2022-04-04 | Disposition: A | Discharge status: Home / Self Care | Payer: Managed Care, Other (non HMO) | Source: Ambulatory Visit | Attending: Obstetrics & Gynecology | Admitting: Obstetrics & Gynecology

## 2022-04-04 DIAGNOSIS — R928 Other abnormal and inconclusive findings on diagnostic imaging of breast: Secondary | ICD-10-CM

## 2022-05-15 ENCOUNTER — Encounter: Payer: Self-pay | Admitting: Family Medicine

## 2022-05-15 ENCOUNTER — Ambulatory Visit (INDEPENDENT_AMBULATORY_CARE_PROVIDER_SITE_OTHER): Payer: Managed Care, Other (non HMO) | Admitting: Family Medicine

## 2022-05-15 VITALS — BP 147/82 | HR 83 | Ht 65.0 in | Wt 207.0 lb

## 2022-05-15 DIAGNOSIS — F411 Generalized anxiety disorder: Secondary | ICD-10-CM

## 2022-05-15 DIAGNOSIS — G43719 Chronic migraine without aura, intractable, without status migrainosus: Secondary | ICD-10-CM | POA: Diagnosis not present

## 2022-05-15 DIAGNOSIS — F988 Other specified behavioral and emotional disorders with onset usually occurring in childhood and adolescence: Secondary | ICD-10-CM

## 2022-05-15 DIAGNOSIS — F320 Major depressive disorder, single episode, mild: Secondary | ICD-10-CM

## 2022-05-15 MED ORDER — ESCITALOPRAM OXALATE 10 MG PO TABS: 10.0000 mg | ORAL_TABLET | Freq: Every day | ORAL | 1 refills | Status: DC

## 2022-05-15 MED ORDER — AMPHETAMINE-DEXTROAMPHET ER 20 MG PO CP24: 20.0000 mg | ORAL_CAPSULE | ORAL | 0 refills | Status: DC

## 2022-05-15 NOTE — Patient Instructions (Addendum)
Let's try adding lexapro.  You may use this daily for best effect or if you feel like symptoms are worse around your menstrual cycle you can start a couple of days prior to this starting.   Continue adderall xr at current strength.   Keep an eye on BP over the next couple of weeks.

## 2022-05-19 ENCOUNTER — Encounter: Payer: Self-pay | Admitting: Family Medicine

## 2022-05-19 NOTE — Assessment & Plan Note (Signed)
Remains well-controlled Adderall XR 20 mg daily.

## 2022-05-19 NOTE — Assessment & Plan Note (Signed)
Migraines are stable with Nurtec every other day for prevention and Maxalt for breakthrough episodes.

## 2022-05-19 NOTE — Assessment & Plan Note (Addendum)
Previously tried on bupropion however did not feel that this was very helpful.  Increasing anxiety with irritability.  Adding Lexapro will titrate based on tolerance and response.  Offered referral for talk therapy, declines at this time.

## 2022-05-19 NOTE — Progress Notes (Signed)
Erin Little - 42 y.o. female MRN [id number]  Date of birth: [birth year]  Subjective Chief Complaint  Patient presents with   Follow-up    HPI Erin Little is a 42 year old female here today for follow-up visit.  She reports having increased anxiety as well as dysphoric mood.  Admits to increased irritability.  Symptoms seem to be worse around menstrual cycle.  She is interested in trying medication to help with this due to increasing severity.  She feels that Adderall at current strength continues to work well for her.  She has not had any significant side effects from medication at current strength.    Migraines remain well-controlled with Nurtec every other day and Maxalt as needed.  No significant side effects with current medications.  ROS:  A comprehensive ROS was completed and negative except as noted per HPI  Allergies  Allergen Reactions   Latex Itching and Rash    Other reaction(s): Flushing (ALLERGY/intolerance)   Sulfamethoxazole-Trimethoprim Hives and Itching   Cephalexin Nausea And Vomiting, Itching and Rash    Hives/vomiting    Nitrofurantoin Nausea And Vomiting, Itching and Rash    Hives/vomiting     Past Medical History:  Diagnosis Date   Migraines     Past Surgical History:  Procedure Laterality Date   APPENDECTOMY     ELBOW ARTHROSCOPY     TONSILLECTOMY      Social History   Socioeconomic History   Marital status: Divorced    Spouse name: Not on file   Number of children: 2   Years of education: Not on file   Highest education level: Not on file  Occupational History   Not on file  Tobacco Use   Smoking status: Never   Smokeless tobacco: Never  Vaping Use   Vaping Use: Never used  Substance and Sexual Activity   Alcohol use: Not Currently   Drug use: Not Currently   Sexual activity: Yes    Birth control/protection: None  Other Topics Concern   Not on file  Social History Narrative   Not on file   Social Determinants of Health    Financial Resource Strain: Not on file  Food Insecurity: Not on file  Transportation Needs: Not on file  Physical Activity: Not on file  Stress: Not on file  Social Connections: Not on file    Family History  Problem Relation Age of Onset   Parkinson's disease Mother    Alzheimer's disease Father     Health Maintenance  Topic Date Due   COVID-19 Vaccine (1) Never done   INFLUENZA VACCINE  07/28/2022 (Originally 11/27/2021)   Hepatitis C Screening  12/26/2022 (Originally 04/02/1997)   HIV Screening  12/26/2022 (Originally 04/02/1994)   DTaP/Tdap/Td (2 - Td or Tdap) 05/21/2023   PAP SMEAR-Modifier  03/25/2025   HPV VACCINES  Aged Out     ----------------------------------------------------------------------------------------------------------------------------------------------------------------------------------------------------------------- Physical Exam BP (!) 147/82   Pulse 83   Ht 5\' 5"  (1.651 m)   Wt 207 lb (93.9 kg)   SpO2 99%   BMI 34.45 kg/m   Physical Exam Constitutional:      Appearance: Normal appearance.  HENT:     Head: Normocephalic and atraumatic.  Eyes:     General: No scleral icterus. Cardiovascular:     Rate and Rhythm: Normal rate and regular rhythm.  Pulmonary:     Effort: Pulmonary effort is normal.     Breath sounds: Normal breath sounds.  Musculoskeletal:     Cervical back: Neck supple.  Neurological:  Mental Status: She is alert.  Psychiatric:        Mood and Affect: Mood normal.        Behavior: Behavior normal.     ------------------------------------------------------------------------------------------------------------------------------------------------------------------------------------------------------------------- Assessment and Plan  Depression, major, single episode, mild (HCC) Previously tried on bupropion however did not feel that this was very helpful.  Increasing anxiety with irritability.  Adding Lexapro will  titrate based on tolerance and response.  Offered referral for talk therapy, declines at this time.  Chronic migraine without aura Migraines are stable with Nurtec every other day for prevention and Maxalt for breakthrough episodes.    ADD (attention deficit disorder) Remains well-controlled Adderall XR 20 mg daily.   Meds ordered this encounter  Medications   escitalopram (LEXAPRO) 10 MG tablet    Sig: Take 1 tablet (10 mg total) by mouth daily. Take 5mg  x7 days then increase to 10mg  daily.    Dispense:  90 tablet    Refill:  1   amphetamine-dextroamphetamine (ADDERALL XR) 20 MG 24 hr capsule    Sig: Take 1 capsule (20 mg total) by mouth every morning.    Dispense:  30 capsule    Refill:  0   amphetamine-dextroamphetamine (ADDERALL XR) 20 MG 24 hr capsule    Sig: Take 1 capsule (20 mg total) by mouth every morning.    Dispense:  30 capsule    Refill:  0    Fill in 30 days.   amphetamine-dextroamphetamine (ADDERALL XR) 20 MG 24 hr capsule    Sig: Take 1 capsule (20 mg total) by mouth every morning.    Dispense:  30 capsule    Refill:  0    Fill in 60 days.    Return in about 3 months (around 08/14/2022) for f/u anxiety.    This visit occurred during the SARS-CoV-2 public health emergency.  Safety protocols were in place, including screening questions prior to the visit, additional usage of staff PPE, and extensive cleaning of exam room while observing appropriate contact time as indicated for disinfecting solutions.

## 2022-05-21 ENCOUNTER — Ambulatory Visit: Payer: Managed Care, Other (non HMO) | Admitting: Physical Therapy

## 2022-05-21 NOTE — Therapy (Incomplete)
OUTPATIENT PHYSICAL THERAPY SHOULDER EVALUATION   Patient Name: Erin Little MRN: [id number] DOB:29-May-1980, 42 y.o., female Today's Date: 05/21/2022    Past Medical History:  Diagnosis Date   Migraines    Past Surgical History:  Procedure Laterality Date   APPENDECTOMY     ELBOW ARTHROSCOPY     TONSILLECTOMY     Patient Active Problem List   Diagnosis Date Noted   Lateral epicondylitis, right elbow 11/22/2021   Depression, major, single episode, mild ([place]) 10/01/2020   Plantar fasciitis, right 09/24/2019   Generalized anxiety disorder 06/28/2015   Insomnia 06/28/2015   Recurrent herpes labialis 06/28/2015   ADD (attention deficit disorder) 10/13/2013   Chronic migraine without aura 10/13/2013   Interstitial cystitis (chronic) without hematuria 10/13/2013   Foot pain, bilateral 06/30/2012    PCP: Luetta Nutting, DO  REFERRING PROVIDER: Guss Bunde, MD  THERAPY DIAG:  No diagnosis found.  REFERRING DIAG: headache  Rationale for Evaluation and Treatment:  Rehabilitation  SUBJECTIVE:  PERTINENT PAST HISTORY:  insomnia        PRECAUTIONS: {Therapy precautions:24002}  WEIGHT BEARING RESTRICTIONS {Yes ***/No:24003}  FALLS:  Has patient fallen in last 6 months? {yes/no:20286}, Number of falls: ***  MOI/History of condition:  Onset date: ***  SUBJECTIVE STATEMENT  Erin Little is a 43 y.o. female who presents to clinic with chief complaint of ***.  ***    Red flags:  {has/denies:26543} {kerredflag:26542}  Pain:  Are you having pain? {yes/no:20286} Pain location: *** NPRS scale:  {NUMBERS; 0-10:5044}/10 to {NUMBERS; 0-10:5044}/10 Aggravating factors: *** Relieving factors: *** Pain description: {PAIN DESCRIPTION:21022940} Stage: {Desc; acute/subacute/chronic:13799} Stability: {kerbetterworse:26715} 24 hour pattern: ***   Occupation: ***  Assistive Device: ***  Hand Dominance: ***  Patient Goals/Specific Activities:  ***   OBJECTIVE:   DIAGNOSTIC FINDINGS:  ***  GENERAL OBSERVATION:  ***     SENSATION:  Light touch: {intact/deficits:24005}   PALPATION: ***  Cervical ROM  ROM ROM  05/21/2022  Flexion ***  Extension ***  Right lateral flexion ***  Left lateral flexion ***  Right rotation ***  Left rotation ***  Flexion rotation (normal is 30 degrees)   Flexion rotation (normal is 30 degrees)     (Blank rows = not tested, N = WNL, * = concordant pain)  UPPER EXTREMITY MMT:  MMT Right 05/21/2022 Left 05/21/2022  Shoulder flexion    Shoulder abduction (C5)    Shoulder ER    Shoulder IR    Middle trapezius    Lower trapezius    Shoulder extension    Grip strength    Shoulder shrug (C4)    Elbow flexion (C6)    Elbow ext (C7)    Thumb ext (C8)    Finger abd (T1)    Grossly     (Blank rows = not tested, score listed is out of 5 possible points.  N = WNL, D = diminished, C = clear for gross weakness with myotome testing, * = concordant pain with testing)   JOINT MOBILITY TESTING:  ***  PATIENT SURVEYS:  {rehab surveys:24030:a}    TODAY'S TREATMENT:  ***    PATIENT EDUCATION:  POC, diagnosis, prognosis, HEP, and outcome measures.  Pt educated via explanation, demonstration, and handout (HEP).  Pt confirms understanding verbally.    HOME EXERCISE PROGRAM: ***  ASSESSMENT:  CLINICAL IMPRESSION: Erin Little is a 42 y.o. female who presents to clinic with signs and sxs consistent with ***.    OBJECTIVE IMPAIRMENTS: Pain, ***  ACTIVITY LIMITATIONS: ***  PERSONAL FACTORS: See medical history and pertinent history   REHAB POTENTIAL: {rehabpotential:25112}  CLINICAL DECISION MAKING: {clinical decision making:25114}  EVALUATION COMPLEXITY: {Evaluation complexity:25115}   GOALS:   SHORT TERM GOALS: Target date: 06/18/2022  Erin Little will be >75% HEP compliant to improve carryover between sessions and facilitate independent management of condition  Evaluation  (05/21/2022): ongoing Goal status: INITIAL   LONG TERM GOALS: Target date: 07/16/2022  Erin Little will improve FOTO score to *** as a proxy for functional improvement  Evaluation/Baseline (05/21/2022): *** Goal status: INITIAL   2.  ***   3.  ***   4.  ***   5.  ***   6.  ***   PLAN: PT FREQUENCY: 1-2x/week  PT DURATION: 8 weeks (Ending 07/16/2022)  PLANNED INTERVENTIONS: Therapeutic exercises, Aquatic therapy, Therapeutic activity, Neuro Muscular re-education, Gait training, Patient/Family education, Joint mobilization, Dry Needling, Electrical stimulation, Spinal mobilization and/or manipulation, Moist heat, Taping, Vasopneumatic device, Ionotophoresis 4mg /ml Dexamethasone, and Manual therapy  PLAN FOR NEXT SESSION: ***   Shearon Balo PT, DPT 05/21/2022, 1:17 PM

## 2022-06-26 ENCOUNTER — Other Ambulatory Visit: Payer: Self-pay | Admitting: Sports Medicine

## 2022-06-26 DIAGNOSIS — M7711 Lateral epicondylitis, right elbow: Secondary | ICD-10-CM

## 2022-07-08 ENCOUNTER — Encounter (INDEPENDENT_AMBULATORY_CARE_PROVIDER_SITE_OTHER): Payer: Managed Care, Other (non HMO) | Admitting: Sports Medicine

## 2022-07-08 ENCOUNTER — Other Ambulatory Visit: Payer: Self-pay | Admitting: Family Medicine

## 2022-07-08 DIAGNOSIS — M7711 Lateral epicondylitis, right elbow: Secondary | ICD-10-CM | POA: Diagnosis not present

## 2022-07-08 NOTE — Telephone Encounter (Signed)
I spent 5 total minutes of online digital evaluation and management services in this patient-initiated request for online care. 

## 2022-07-12 NOTE — Addendum Note (Signed)
Addended by: Silverio Decamp on: 07/12/2022 12:40 PM   Modules accepted: Orders

## 2022-07-30 NOTE — Therapy (Signed)
OUTPATIENT PHYSICAL THERAPY EVALUATION   Patient Name: Erin Little MRN: [id number] DOB:1980/06/23, 42 y.o., female Today's Date: 07/31/2022   END OF SESSION:  PT End of Session - 07/31/22 0755     Visit Number 1    Number of Visits 9    Date for PT Re-Evaluation 09/25/22    Authorization Type [employer]    PT Start Time 0735    PT Stop Time 0825    PT Time Calculation (min) 50 min    Activity Tolerance Patient tolerated treatment well    Behavior During Therapy [employer] for tasks assessed/performed             Past Medical History:  Diagnosis Date   Migraines    Past Surgical History:  Procedure Laterality Date   APPENDECTOMY     ELBOW ARTHROSCOPY     TONSILLECTOMY     Patient Active Problem List   Diagnosis Date Noted   Lateral epicondylitis, right elbow 11/22/2021   Depression, major, single episode, mild 10/01/2020   Plantar fasciitis, right 09/24/2019   Generalized anxiety disorder 06/28/2015   Insomnia 06/28/2015   Recurrent herpes labialis 06/28/2015   ADD (attention deficit disorder) 10/13/2013   Chronic migraine without aura 10/13/2013   Interstitial cystitis (chronic) without hematuria 10/13/2013   Foot pain, bilateral 06/30/2012    PCP: Luetta Nutting, DO  REFERRING PROVIDER: Silverio Decamp, MD; Guss Bunde, MD  REFERRING DIAG: Lateral epicondylitis, right elbow; Chronic nonintractable headache, unspecified headache type  THERAPY DIAG:  Pain in right elbow  Cervicalgia  Muscle weakness (generalized)  Rationale for Evaluation and Treatment: Rehabilitation  ONSET DATE: Chronic   SUBJECTIVE:                                                                                                                                                                                     SUBJECTIVE STATEMENT: Patient reports she has right tennis elbow and she did have an injection in July of 2023 that helped but has worn off and now having more pain and  has difficulty straightening out the right the right elbow due to pain. She is now taking more medication in order to manage her elbow pain, and if she doesn't take it then she has constant pain with all movement and activity. At work she uses an injector and it is difficult to use due to the pain. She also reports chronic tension migraines and neck tightness. She did have a series of dry needling years ago that helped with the headaches and tightness, but now the tightness has returned. Typically migraines will start in the back of the shoulders and neck, and then move  up and her left side gives her more issues. She does note that migraines get worse right before her menstrual cycle. She states when she has a headache, it will be like a radiating pain and typically has trouble looking toward the left. She notes she was has difficulty sleeping due to the elbow pain and migraines. Hand dominance: Right  PERTINENT HISTORY: See PMH above  PAIN:  Are you having pain? Yes:  NPRS scale: 6/10 Pain location: Right elbow Pain description: Tender, throbbing Aggravating factors: Straightening elbow, lifting, carrying with right arm Relieving factors: Medication  NPRS scale: 0/10, 8-10 when tension migraines occur Pain location: Neck, headaches Pain description: Radiating, tightness Aggravating factors: Stress induced and can worsen with rest, prior to menstrual cycle Relieving factors: Medication  PRECAUTIONS: None  WEIGHT BEARING RESTRICTIONS: No  FALLS:  Has patient fallen in last 6 months? No  PLOF: Independent  PATIENT GOALS: Pain relief   OBJECTIVE:  PATIENT SURVEYS:  FOTO 47% functional status  (elbow)  COGNITION: Overall cognitive status: Within functional limits for tasks assessed     SENSATION: WFL  POSTURE: Rounded shoulder posture, right elbow held in guarded and bend position  CERVICAL ROM:   Active ROM A/PROM (deg) eval  Flexion 65  Extension 45  Right lateral  flexion 30  Left lateral flexion 30  Right rotation 70  Left rotation 70   (Blank rows = not tested)  UPPER EXTREMITY ROM:   Active ROM Right eval Left eval  Shoulder flexion    Shoulder extension    Shoulder abduction    Shoulder adduction    Shoulder internal rotation    Shoulder external rotation    Elbow flexion 145 145  Elbow extension 10 lacking 15 hyper  Wrist flexion    Wrist extension    Wrist ulnar deviation    Wrist radial deviation    Wrist pronation    Wrist supination    (Blank rows = not tested)  Patient is able to achieve full right elbow extension PROM  UPPER EXTREMITY MMT:  MMT Right eval Left eval  Shoulder flexion    Shoulder extension    Shoulder abduction    Shoulder adduction    Shoulder internal rotation    Shoulder external rotation    Middle trapezius    Lower trapezius    Elbow flexion 5* 5  Elbow extension 5 5  Wrist flexion 5 5  Wrist extension 4 5  Wrist ulnar deviation 5 5  Wrist radial deviation 4 5  Wrist pronation 5 5  Wrist supination 4 5  Grip strength (lbs) 55 75  (Blank rows = not tested)  Patient with 4/5 MMT and pain with elbow flexion in neutral and pronated position   JOINT MOBILITY TESTING:  Not assessed  PALPATION:  Tender to palpation right proximal wrist extensor muscle group, bilateral upper trap and suboccipital region    TODAY'S TREATMENT:     [facility] Adult PT Treatment:                                                DATE: 07/31/2022 Therapeutic Exercise: Wrist flexion and extension with red x 10 each Wrist pronation/supination with hammer x 10 Wrist radial deviation with hammer gripped halfway x 10  PATIENT EDUCATION: Education details: Exam findings, POC, HEP Person educated: Patient Education method: Explanation, Demonstration, Corporate treasurer  cues, Verbal cues, and Handouts Education comprehension: verbalized understanding, returned demonstration, verbal cues required, tactile cues required, and needs  further education  HOME EXERCISE PROGRAM: Access Code: [email or web address]    ASSESSMENT: CLINICAL IMPRESSION: Patient is a 42 y.o. female who was seen today for physical therapy evaluation and treatment for chronic right elbow and neck pain with headaches. Her right elbow pain does seem consistent with lateral epicondylagia with associated elbow motion and strength limitations. She also reports cervicogenic headaches with neck tightness that has previously been treated with dry needling that has been beneficial.   OBJECTIVE IMPAIRMENTS: decreased activity tolerance, decreased ROM, decreased strength, postural dysfunction, and pain.   ACTIVITY LIMITATIONS: carrying, lifting, sleeping, and hygiene/grooming  PARTICIPATION LIMITATIONS: meal prep, cleaning, laundry, shopping, and occupation  PERSONAL FACTORS: Past/current experiences and Time since onset of injury/illness/exacerbation are also affecting patient's functional outcome.   REHAB POTENTIAL: Good  CLINICAL DECISION MAKING: Stable/uncomplicated  EVALUATION COMPLEXITY: Low   GOALS: Goals reviewed with patient? Yes  SHORT TERM GOALS: Target date: 08/28/2022  Patient will be I with initial HEP in order to progress with therapy. Baseline: HEP provided at eval Goal status: INITIAL  2.  PT will review FOTO with patient by 3rd visit in order to understand expected progress and outcome with therapy. Baseline: FOTO assessed at eval Goal status: INITIAL  3.  Patient will report right elbow pain </= 2/10 with activity in order to reduce work related limitations with lifting and grasping tasks Baseline: 6/10 pain Goal status: INITIAL  LONG TERM GOALS: Target date: 09/25/2022  Patient will be I with final HEP to maintain progress from PT. Baseline: HEP provided at eval Goal status: INITIAL  2.  Patient will report >/= 60% status on FOTO to indicate improved functional ability. Baseline: 47% functional status Goal status: INITIAL  3.   Patient will demonstrate right wrist and elbow strength >/= 5/5 MMT in order to improve lifting and holding objects using the right arm Baseline: see limitations noted above Goal status: INITIAL  4.  Patient will report 80% resolution of chronic migraines and neck tension in order to reduce functional limitations and improve sleeping ability Baseline: patient reports frequent migraines and neck tension that reach 8-1/10 pain when occurs  Goal status: INITIAL   PLAN: PT FREQUENCY: 1x/week  PT DURATION: 8 weeks  PLANNED INTERVENTIONS: Therapeutic exercises, Therapeutic activity, Neuromuscular re-education, Balance training, Gait training, Patient/Family education, Self Care, Joint mobilization, Joint manipulation, Aquatic Therapy, Dry Needling, Electrical stimulation, Spinal mobilization, Cryotherapy, Moist heat, Taping, Ionotophoresis 4mg /ml Dexamethasone, Manual therapy, and Re-evaluation  PLAN FOR NEXT SESSION: Review HEP and progress PRN, manual/dry needling for bilateral upper trap and suboccipital region/and right wrist extensor muscle group, trial mobilization with gripping/wrist strengthening for pain reduction, progress right elbow and wrist strengthening, postural strengthening and control, trial ionto for right elbow   Hilda Blades, PT, DPT, LAT, ATC 07/31/22  2:23 PM Phone: [phone] Fax: [phone]

## 2022-07-31 ENCOUNTER — Ambulatory Visit: Payer: Managed Care, Other (non HMO) | Attending: Sports Medicine | Admitting: Physical Therapy

## 2022-07-31 ENCOUNTER — Other Ambulatory Visit: Payer: Self-pay

## 2022-07-31 ENCOUNTER — Encounter: Payer: Self-pay | Admitting: Physical Therapy

## 2022-07-31 DIAGNOSIS — M6281 Muscle weakness (generalized): Secondary | ICD-10-CM | POA: Diagnosis present

## 2022-07-31 DIAGNOSIS — M542 Cervicalgia: Secondary | ICD-10-CM | POA: Diagnosis present

## 2022-07-31 DIAGNOSIS — M7711 Lateral epicondylitis, right elbow: Secondary | ICD-10-CM | POA: Insufficient documentation

## 2022-07-31 DIAGNOSIS — M25521 Pain in right elbow: Secondary | ICD-10-CM | POA: Diagnosis present

## 2022-07-31 NOTE — Patient Instructions (Signed)
Access Code: [email or web address] URL: [email or web address] Date: 07/31/2022 Prepared by: Hilda Blades  Exercises - Seated Wrist Extension with Anchored Resistance  - 1 x daily - 2 sets - 10 reps - Wrist Flexion with Resistance  - 1 x daily - 2 sets - 10 reps - Forearm Pronation and Supination with Hammer  - 1 x daily - 2 sets - 10 reps - Standing Wrist Radial Deviation with Hammer  - 1 x daily - 2 sets - 10 reps

## 2022-08-05 ENCOUNTER — Ambulatory Visit: Payer: Managed Care, Other (non HMO)

## 2022-08-07 NOTE — Therapy (Signed)
OUTPATIENT PHYSICAL THERAPY TREATMENT NOTE   Patient Name: Laural RoesHelen Clucas MRN: [id number] DOB:12/17/1980, 42 y.o., female Today's Date: 08/08/2022  PCP: Everrett CoombeMatthews, Cody, DO  REFERRING PROVIDER: Monica Bectonhekkekandam, Thomas J, MD; Lesly DukesLeggett, Kelly H, MD   END OF SESSION:   PT End of Session - 08/08/22 0932     Visit Number 2    Number of Visits 9    Date for PT Re-Evaluation 09/25/22    Authorization Type [employer]    PT Start Time 0932    PT Stop Time 1025    PT Time Calculation (min) 53 min    Activity Tolerance Patient tolerated treatment well    Behavior During Therapy [facility] for tasks assessed/performed             Past Medical History:  Diagnosis Date   Migraines    Past Surgical History:  Procedure Laterality Date   APPENDECTOMY     ELBOW ARTHROSCOPY     TONSILLECTOMY     Patient Active Problem List   Diagnosis Date Noted   Lateral epicondylitis, right elbow 11/22/2021   Depression, major, single episode, mild 10/01/2020   Plantar fasciitis, right 09/24/2019   Generalized anxiety disorder 06/28/2015   Insomnia 06/28/2015   Recurrent herpes labialis 06/28/2015   ADD (attention deficit disorder) 10/13/2013   Chronic migraine without aura 10/13/2013   Interstitial cystitis (chronic) without hematuria 10/13/2013   Foot pain, bilateral 06/30/2012    REFERRING DIAG: Lateral epicondylitis, right elbow; Chronic nonintractable headache, unspecified headache type   THERAPY DIAG:  Pain in right elbow  Cervicalgia  Muscle weakness (generalized)  Rationale for Evaluation and Treatment Rehabilitation  PERTINENT HISTORY: see PMH above   PRECAUTIONS: none   SUBJECTIVE:                                                                                                                                                                                      SUBJECTIVE STATEMENT:  Patient thinks her elbow pain is being masked by the diclofenac she is taking. She is not having any pain  in the elbow at the moment. No headache at the moment. Did have a headache on Tuesday that was pretty bad.    PAIN:  Are you having pain? No   OBJECTIVE: (objective measures completed at initial evaluation unless otherwise dated)  PATIENT SURVEYS:  FOTO 47% functional status  (elbow)   COGNITION: Overall cognitive status: Within functional limits for tasks assessed                                  [facility]  POSTURE: Rounded shoulder posture, right elbow held in guarded and bend position   CERVICAL ROM:    Active ROM A/PROM (deg) eval 08/08/22  Flexion 65   Extension 45 50  Right lateral flexion 30   Left lateral flexion 30   Right rotation 70   Left rotation 70    (Blank rows = not tested)   UPPER EXTREMITY ROM:    Active ROM Right eval Left eval  Shoulder flexion      Shoulder extension      Shoulder abduction      Shoulder adduction      Shoulder internal rotation      Shoulder external rotation      Elbow flexion 145 145  Elbow extension 10 lacking 15 hyper  Wrist flexion      Wrist extension      Wrist ulnar deviation      Wrist radial deviation      Wrist pronation      Wrist supination      (Blank rows = not tested)   Patient is able to achieve full right elbow extension PROM   UPPER EXTREMITY MMT:   MMT Right eval Left eval  Shoulder flexion      Shoulder extension      Shoulder abduction      Shoulder adduction      Shoulder internal rotation      Shoulder external rotation      Middle trapezius      Lower trapezius      Elbow flexion 5* 5  Elbow extension 5 5  Wrist flexion 5 5  Wrist extension 4 5  Wrist ulnar deviation 5 5  Wrist radial deviation 4 5  Wrist pronation 5 5  Wrist supination 4 5  Grip strength (lbs) 55 75  (Blank rows = not tested)   Patient with 4/5 MMT and pain with elbow flexion in neutral and pronated position    JOINT MOBILITY TESTING:  Not assessed   PALPATION:  Tender to palpation right proximal  wrist extensor muscle group, bilateral upper trap and suboccipital region               TODAY'S TREATMENT:     [facility] Adult PT Treatment:                                                DATE: 08/08/22 Therapeutic Exercise: Supine cervical retraction x 10; 5 sec hold Cervical extension SNAG upper and lower x 10 each  Upper trap stretch x 20 sec each Levator scap stretch x 20 sec each Updated HEP  Manual Therapy: STM/DTM Rt forearm extensor mass Rt elbow distraction STM bilateral upper traps, cervical paraspinals, levator scapulae Passive upper trap stretching Suboccipital release CPAs grade II-III C2-T2  Trigger Point Dry Needling Treatment: Pre-treatment instruction: Patient instructed on dry needling rationale, procedures, and possible side effects including pain during treatment (achy,cramping feeling), bruising, drop of blood, lightheadedness, nausea, sweating. Patient Consent Given: Yes Education handout provided: Yes Muscles treated: bilateral upper trap   Needle size and number: .30x41mm x 2 Electrical stimulation performed: No Parameters: N/A Treatment response/outcome: Twitch response elicited and Palpable decrease in muscle tension Post-treatment instructions: Patient instructed to expect possible mild to moderate muscle soreness later today and/or tomorrow. Patient instructed in methods to reduce muscle soreness and to continue prescribed  HEP. If patient was dry needled over the lung field, patient was instructed on signs and symptoms of pneumothorax and, however unlikely, to see immediate medical attention should they occur. Patient was also educated on signs and symptoms of infection and to seek medical attention should they occur. Patient verbalized understanding of these instructions and education.  Modalities:  MHP neck x 10 minutes; Ice pack Rt elbow x 10 minutes    [facility] Adult PT Treatment:                                                DATE: 07/31/2022 Therapeutic  Exercise: Wrist flexion and extension with red x 10 each Wrist pronation/supination with hammer x 10 Wrist radial deviation with hammer gripped halfway x 10   PATIENT EDUCATION: Education details: HEP; TPDN Person educated: Patient Education method: Explanation, Demonstration, Tactile cues, Verbal cues, and Handouts Education comprehension: verbalized understanding, returned demonstration, verbal cues required, tactile cues required, and needs further education   HOME EXERCISE PROGRAM: Access Code: [email or web address]      ASSESSMENT: CLINICAL IMPRESSION: Patient arrives without reports of elbow pain attributed to taking her diclofenac and most recent headache occurring on Tuesday. Focused session today on her neck as she reports this is her chief complaint at the moment. TPDN was performed to bilateral upper trap with excellent twitch response elicited with patient reporting soreness post-intervention as expected. Introduced cervical mobility/stretching and neck flexor strengthening today with good tolerance. HEP was updated to include stretching and strengthening for her neck. Finished session with heat pack to cervical region and ice pack to Rt elbow to assist in reducing soreness.    OBJECTIVE IMPAIRMENTS: decreased activity tolerance, decreased ROM, decreased strength, postural dysfunction, and pain.    ACTIVITY LIMITATIONS: carrying, lifting, sleeping, and hygiene/grooming   PARTICIPATION LIMITATIONS: meal prep, cleaning, laundry, shopping, and occupation   PERSONAL FACTORS: Past/current experiences and Time since onset of injury/illness/exacerbation are also affecting patient's functional outcome.    REHAB POTENTIAL: Good   CLINICAL DECISION MAKING: Stable/uncomplicated   EVALUATION COMPLEXITY: Low     GOALS: Goals reviewed with patient? Yes   SHORT TERM GOALS: Target date: 08/28/2022   Patient will be I with initial HEP in order to progress with therapy. Baseline: HEP provided at  eval Goal status: INITIAL   2.  PT will review FOTO with patient by 3rd visit in order to understand expected progress and outcome with therapy. Baseline: FOTO assessed at eval Goal status: INITIAL   3.  Patient will report right elbow pain </= 2/10 with activity in order to reduce work related limitations with lifting and grasping tasks Baseline: 6/10 pain Goal status: INITIAL   LONG TERM GOALS: Target date: 09/25/2022   Patient will be I with final HEP to maintain progress from PT. Baseline: HEP provided at eval Goal status: INITIAL   2.  Patient will report >/= 60% status on FOTO to indicate improved functional ability. Baseline: 47% functional status Goal status: INITIAL   3.  Patient will demonstrate right wrist and elbow strength >/= 5/5 MMT in order to improve lifting and holding objects using the right arm Baseline: see limitations noted above Goal status: INITIAL   4.  Patient will report 80% resolution of chronic migraines and neck tension in order to reduce functional limitations and improve sleeping ability Baseline: patient reports frequent migraines and  neck tension that reach 8-1/10 pain when occurs  Goal status: INITIAL     PLAN: PT FREQUENCY: 1x/week   PT DURATION: 8 weeks   PLANNED INTERVENTIONS: Therapeutic exercises, Therapeutic activity, Neuromuscular re-education, Balance training, Gait training, Patient/Family education, Self Care, Joint mobilization, Joint manipulation, Aquatic Therapy, Dry Needling, Electrical stimulation, Spinal mobilization, Cryotherapy, Moist heat, Taping, Ionotophoresis 4mg /ml Dexamethasone, Manual therapy, and Re-evaluation   PLAN FOR NEXT SESSION: Review HEP and progress PRN, manual/dry needling for bilateral upper trap and suboccipital region/and right wrist extensor muscle group, trial mobilization with gripping/wrist strengthening for pain reduction, progress right elbow and wrist strengthening, postural strengthening and control,  trial ionto for right elbow      Letitia Libra, PT, DPT, ATC 08/08/22 10:53 AM

## 2022-08-08 ENCOUNTER — Ambulatory Visit: Payer: Managed Care, Other (non HMO)

## 2022-08-08 DIAGNOSIS — M25521 Pain in right elbow: Secondary | ICD-10-CM | POA: Diagnosis not present

## 2022-08-08 DIAGNOSIS — M6281 Muscle weakness (generalized): Secondary | ICD-10-CM

## 2022-08-08 DIAGNOSIS — M542 Cervicalgia: Secondary | ICD-10-CM

## 2022-08-08 NOTE — Patient Instructions (Signed)

## 2022-08-09 ENCOUNTER — Other Ambulatory Visit: Payer: Self-pay | Admitting: Family Medicine

## 2022-08-09 ENCOUNTER — Encounter: Payer: Self-pay | Admitting: Family Medicine

## 2022-08-09 DIAGNOSIS — M7711 Lateral epicondylitis, right elbow: Secondary | ICD-10-CM

## 2022-08-09 MED ORDER — DICLOFENAC SODIUM 75 MG PO TBEC: 75.0000 mg | DELAYED_RELEASE_TABLET | Freq: Two times a day (BID) | ORAL | 3 refills | Status: DC

## 2022-08-09 MED ORDER — NURTEC 75 MG PO TBDP: 1.0000 | ORAL_TABLET | ORAL | 2 refills | Status: DC

## 2022-08-09 MED ORDER — RIZATRIPTAN BENZOATE 10 MG PO TBDP: 10.0000 mg | ORAL_TABLET | ORAL | 0 refills | Status: AC | PRN

## 2022-08-09 MED ORDER — TIZANIDINE HCL 4 MG PO CAPS: 4.0000 mg | ORAL_CAPSULE | Freq: Three times a day (TID) | ORAL | 3 refills | Status: DC | PRN

## 2022-08-09 MED ORDER — AMPHETAMINE-DEXTROAMPHET ER 20 MG PO CP24: 20.0000 mg | ORAL_CAPSULE | ORAL | 0 refills | Status: DC

## 2022-08-09 NOTE — Telephone Encounter (Signed)
Requesting rx rf for Adderall 20mg  XR  Last written 05/15/2022 Last OV 05/15/2022 Next schld appt 08/14/2022

## 2022-08-12 ENCOUNTER — Encounter: Payer: Self-pay | Admitting: Physical Therapy

## 2022-08-12 ENCOUNTER — Encounter: Payer: Self-pay | Admitting: *Deleted

## 2022-08-13 NOTE — Therapy (Signed)
OUTPATIENT PHYSICAL THERAPY TREATMENT NOTE   Patient Name: Erin Little MRN: [id number] DOB:March 28, 1981, 42 y.o., female Today's Date: 08/14/2022   PCP: Everrett Coombe, DO  REFERRING PROVIDER: Monica Becton, MD; Lesly Dukes, MD    END OF SESSION:   PT End of Session - 08/14/22 0814     Visit Number 3    Number of Visits 9    Date for PT Re-Evaluation 09/25/22    Authorization Type [employer]    PT Start Time 0810    PT Stop Time 0854    PT Time Calculation (min) 44 min    Activity Tolerance Patient tolerated treatment well    Behavior During Therapy [facility] for tasks assessed/performed              Past Medical History:  Diagnosis Date   Migraines    Past Surgical History:  Procedure Laterality Date   APPENDECTOMY     ELBOW ARTHROSCOPY     TONSILLECTOMY     Patient Active Problem List   Diagnosis Date Noted   Lateral epicondylitis, right elbow 11/22/2021   Depression, major, single episode, mild 10/01/2020   Plantar fasciitis, right 09/24/2019   Generalized anxiety disorder 06/28/2015   Insomnia 06/28/2015   Recurrent herpes labialis 06/28/2015   ADD (attention deficit disorder) 10/13/2013   Chronic migraine without aura 10/13/2013   Interstitial cystitis (chronic) without hematuria 10/13/2013   Foot pain, bilateral 06/30/2012    REFERRING DIAG: Lateral epicondylitis, right elbow; Chronic nonintractable headache, unspecified headache type   THERAPY DIAG:  Pain in right elbow  Cervicalgia  Muscle weakness (generalized)  Rationale for Evaluation and Treatment Rehabilitation  PERTINENT HISTORY: see PMH above   PRECAUTIONS: none    SUBJECTIVE:                                                                                                                                                                                     SUBJECTIVE STATEMENT:  Patient reports she would like to try needling for the right elbow. She did have a migraine after  last visit due to the needling. Continues to have elbow pain with most activities.   PAIN:  Are you having pain? Yes:  NPRS scale: 6/10 Pain location: Right elbow Pain description: Tender, throbbing Aggravating factors: Straightening elbow, lifting, carrying with right arm Relieving factors: Medication   NPRS scale: 0/10, 8-10 when tension migraines occur Pain location: Neck, headaches Pain description: Radiating, tightness Aggravating factors: Stress induced and can worsen with rest, prior to menstrual cycle Relieving factors: Medication   OBJECTIVE: (objective measures completed at initial evaluation unless otherwise dated) PATIENT SURVEYS:  FOTO 47% functional status  (  elbow)   POSTURE: Rounded shoulder posture, right elbow held in guarded and bend position   CERVICAL ROM:    Active ROM A/PROM (deg) eval 08/08/22  Flexion 65   Extension 45 50  Right lateral flexion 30   Left lateral flexion 30   Right rotation 70   Left rotation 70    (Blank rows = not tested)   UPPER EXTREMITY ROM:    Active ROM Right eval Left eval Right 08/14/2022  Shoulder flexion       Shoulder extension       Shoulder abduction       Shoulder adduction       Shoulder internal rotation       Shoulder external rotation       Elbow flexion 145 145   Elbow extension 10 lacking 15 hyper 5 hyper  Wrist flexion       Wrist extension       Wrist ulnar deviation       Wrist radial deviation       Wrist pronation       Wrist supination       (Blank rows = not tested)   Patient is able to achieve full right elbow extension PROM   UPPER EXTREMITY MMT:   MMT Right eval Left eval  Shoulder flexion      Shoulder extension      Shoulder abduction      Shoulder adduction      Shoulder internal rotation      Shoulder external rotation      Middle trapezius      Lower trapezius      Elbow flexion 5* 5  Elbow extension 5 5  Wrist flexion 5 5  Wrist extension 4 5  Wrist ulnar deviation 5  5  Wrist radial deviation 4 5  Wrist pronation 5 5  Wrist supination 4 5  Grip strength (lbs) 55 75  (Blank rows = not tested)   Patient with 4/5 MMT and pain with elbow flexion in neutral and pronated position    JOINT MOBILITY TESTING:  Not assessed   PALPATION:  Tender to palpation right proximal wrist extensor muscle group, bilateral upper trap and suboccipital region               TODAY'S TREATMENT:     [facility] Adult PT Treatment:                                                DATE: 08/14/22 Therapeutic Exercise: UBE L1 x 4 min (fwd/bwd) while taking subjective Wrist extension 3# 2 x 10 Therabar wrist extension eccentric x 5 Farmer's carry hold 3 x 30 sec with 30# Pronation / supination with 2# 2 x 10 Manual Therapy: Skilled palpation and monitoring of muscle tension while performing TPDN  STM right wrist extensor muscle group Trigger Point Dry Needling Treatment: Pre-treatment instruction: Patient instructed on dry needling rationale, procedures, and possible side effects including pain during treatment (achy,cramping feeling), bruising, drop of blood, lightheadedness, nausea, sweating. Patient Consent Given: Yes Education handout provided: Yes Muscles treated: Right wrist extensor group Needle size and number: .30x55mm x 2 Electrical stimulation performed: No Parameters: N/A Treatment response/outcome: Twitch response elicited and Palpable decrease in muscle tension Post-treatment instructions: Patient instructed to expect possible mild to moderate muscle soreness later today and/or tomorrow.  Patient instructed in methods to reduce muscle soreness and to continue prescribed HEP. If patient was dry needled over the lung field, patient was instructed on signs and symptoms of pneumothorax and, however unlikely, to see immediate medical attention should they occur. Patient was also educated on signs and symptoms of infection and to seek medical attention should they occur. Patient  verbalized understanding of these instructions and education. Modalities: Iontophoresis 4 hour extended release patch, 1.57ml dexamethasone, 80 mA-min placed at right elbow wrist extensor muscle group insertion   [facility] Adult PT Treatment:                                                DATE: 08/08/22 Therapeutic Exercise: Supine cervical retraction x 10; 5 sec hold Cervical extension SNAG upper and lower x 10 each  Upper trap stretch x 20 sec each Levator scap stretch x 20 sec each Updated HEP  Manual Therapy: STM/DTM Rt forearm extensor mass Rt elbow distraction STM bilateral upper traps, cervical paraspinals, levator scapulae Passive upper trap stretching Suboccipital release CPAs grade II-III C2-T2  Trigger Point Dry Needling Treatment: Pre-treatment instruction: Patient instructed on dry needling rationale, procedures, and possible side effects including pain during treatment (achy,cramping feeling), bruising, drop of blood, lightheadedness, nausea, sweating. Patient Consent Given: Yes Education handout provided: Yes Muscles treated: bilateral upper trap   Needle size and number: .30x35mm x 2 Electrical stimulation performed: No Parameters: N/A Treatment response/outcome: Twitch response elicited and Palpable decrease in muscle tension Post-treatment instructions: Patient instructed to expect possible mild to moderate muscle soreness later today and/or tomorrow. Patient instructed in methods to reduce muscle soreness and to continue prescribed HEP. If patient was dry needled over the lung field, patient was instructed on signs and symptoms of pneumothorax and, however unlikely, to see immediate medical attention should they occur. Patient was also educated on signs and symptoms of infection and to seek medical attention should they occur. Patient verbalized understanding of these instructions and education. Modalities:  MHP neck x 10 minutes; Ice pack Rt elbow x 10 minutes   [facility]  Adult PT Treatment:                                                DATE: 07/31/2022 Therapeutic Exercise: Wrist flexion and extension with red x 10 each Wrist pronation/supination with hammer x 10 Wrist radial deviation with hammer gripped halfway x 10   PATIENT EDUCATION: Education details: HEP, TPDN, iontophoresis Person educated: Patient Education method: Explanation, Demonstration, Tactile cues, Verbal cues Education comprehension: verbalized understanding, returned demonstration, verbal cues required, tactile cues required, and needs further education   HOME EXERCISE PROGRAM: Access Code: [email or web address]      ASSESSMENT: CLINICAL IMPRESSION: Patient tolerated therapy well with no adverse effects. Performed TPDN for right wrist extensor muscle group with multiple twitch responses. Therapy focused on progressing resistance for strengthening exercises and applied dexamethasone ionto patch to right elbow following session. She does report pain with exercises and able to demonstrate improved elbow extension this visit with pain. No changes made to HEP this visit. Patient would benefit from continued skilled PT to progress her mobility and strength in order to reduce pain and maximize functional ability.    OBJECTIVE  IMPAIRMENTS: decreased activity tolerance, decreased ROM, decreased strength, postural dysfunction, and pain.    ACTIVITY LIMITATIONS: carrying, lifting, sleeping, and hygiene/grooming   PARTICIPATION LIMITATIONS: meal prep, cleaning, laundry, shopping, and occupation   PERSONAL FACTORS: Past/current experiences and Time since onset of injury/illness/exacerbation are also affecting patient's functional outcome.      GOALS: Goals reviewed with patient? Yes   SHORT TERM GOALS: Target date: 08/28/2022   Patient will be I with initial HEP in order to progress with therapy. Baseline: HEP provided at eval Goal status: INITIAL   2.  PT will review FOTO with patient by 3rd visit in  order to understand expected progress and outcome with therapy. Baseline: FOTO assessed at eval Goal status: INITIAL   3.  Patient will report right elbow pain </= 2/10 with activity in order to reduce work related limitations with lifting and grasping tasks Baseline: 6/10 pain Goal status: INITIAL   LONG TERM GOALS: Target date: 09/25/2022   Patient will be I with final HEP to maintain progress from PT. Baseline: HEP provided at eval Goal status: INITIAL   2.  Patient will report >/= 60% status on FOTO to indicate improved functional ability. Baseline: 47% functional status Goal status: INITIAL   3.  Patient will demonstrate right wrist and elbow strength >/= 5/5 MMT in order to improve lifting and holding objects using the right arm Baseline: see limitations noted above Goal status: INITIAL   4.  Patient will report 80% resolution of chronic migraines and neck tension in order to reduce functional limitations and improve sleeping ability Baseline: patient reports frequent migraines and neck tension that reach 8-1/10 pain when occurs  Goal status: INITIAL     PLAN: PT FREQUENCY: 1x/week   PT DURATION: 8 weeks   PLANNED INTERVENTIONS: Therapeutic exercises, Therapeutic activity, Neuromuscular re-education, Balance training, Gait training, Patient/Family education, Self Care, Joint mobilization, Joint manipulation, Aquatic Therapy, Dry Needling, Electrical stimulation, Spinal mobilization, Cryotherapy, Moist heat, Taping, Ionotophoresis 4mg /ml Dexamethasone, Manual therapy, and Re-evaluation   PLAN FOR NEXT SESSION: Review HEP and progress PRN, manual/dry needling for bilateral upper trap and suboccipital region/and right wrist extensor muscle group, trial mobilization with gripping/wrist strengthening for pain reduction, progress right elbow and wrist strengthening, postural strengthening and control, trial ionto for right elbow      Rosana Hoes, PT, DPT, LAT, ATC 08/14/22   9:08 AM Phone: [phone] Fax: [phone]

## 2022-08-14 ENCOUNTER — Encounter: Payer: Self-pay | Admitting: Physical Therapy

## 2022-08-14 ENCOUNTER — Encounter: Payer: Self-pay | Admitting: Family Medicine

## 2022-08-14 ENCOUNTER — Telehealth: Payer: Managed Care, Other (non HMO) | Admitting: Family Medicine

## 2022-08-14 ENCOUNTER — Other Ambulatory Visit: Payer: Self-pay

## 2022-08-14 ENCOUNTER — Ambulatory Visit: Payer: Managed Care, Other (non HMO) | Admitting: Physical Therapy

## 2022-08-14 DIAGNOSIS — G43719 Chronic migraine without aura, intractable, without status migrainosus: Secondary | ICD-10-CM | POA: Diagnosis not present

## 2022-08-14 DIAGNOSIS — M6281 Muscle weakness (generalized): Secondary | ICD-10-CM

## 2022-08-14 DIAGNOSIS — F411 Generalized anxiety disorder: Secondary | ICD-10-CM | POA: Diagnosis not present

## 2022-08-14 DIAGNOSIS — M25521 Pain in right elbow: Secondary | ICD-10-CM | POA: Diagnosis not present

## 2022-08-14 DIAGNOSIS — M542 Cervicalgia: Secondary | ICD-10-CM

## 2022-08-14 NOTE — Assessment & Plan Note (Signed)
Recommended that she take nurtec every other day for this to be effective for migraine prevention. She will start this and let me know if this persists after a few weeks.

## 2022-08-14 NOTE — Assessment & Plan Note (Signed)
Stable with lexapro at current strength.  Recommend continuation.

## 2022-08-14 NOTE — Progress Notes (Signed)
Erin Little - 42 y.o. female MRN [id number]  Date of birth: 01-Feb-1981   This visit type was conducted due to national recommendations for restrictions regarding the COVID-19 Pandemic (e.g. social distancing).  This format is felt to be most appropriate for this patient at this time.  All issues noted in this document were discussed and addressed.  No physical exam was performed (except for noted visual exam findings with Video Visits).  I discussed the limitations of evaluation and management by telemedicine and the availability of in person appointments. The patient expressed understanding and agreed to proceed.  I connected withNAME@ on 08/14/22 at  3:30 PM EDT by a video enabled telemedicine application and verified that I am speaking with the correct person using two identifiers.  Present at visit: Everrett Coombe, DO Laural Roes   Patient Location: Work [address] [place] [address]   Provider location:   [facility]  No chief complaint on file.   HPI  Erin Little is a 42 y.o. female who presents via audio/video conferencing for a telehealth visit today.  She is following up today.  She is doing Physical therapy with dry needling for elbow and neck pain.  Just started but doing ok with this.   Continues to have headaches.  Using nurtec as needed instead of every other day.  It is effective when using.    Anxiety is stable.  Remains on lexapro and is tolerating this well.     ROS:  A comprehensive ROS was completed and negative except as noted per HPI  Past Medical History:  Diagnosis Date   Migraines     Past Surgical History:  Procedure Laterality Date   APPENDECTOMY     ELBOW ARTHROSCOPY     TONSILLECTOMY      Family History  Problem Relation Age of Onset   Parkinson's disease Mother    Alzheimer's disease Father     Social History   Socioeconomic History   Marital status: Divorced    Spouse name: Not on file   Number of children:  2   Years of education: Not on file   Highest education level: Not on file  Occupational History   Not on file  Tobacco Use   Smoking status: Never   Smokeless tobacco: Never  Vaping Use   Vaping Use: Never used  Substance and Sexual Activity   Alcohol use: Not Currently   Drug use: Not Currently   Sexual activity: Yes    Birth control/protection: None  Other Topics Concern   Not on file  Social History Narrative   Not on file   Social Determinants of Health   Financial Resource Strain: Not on file  Food Insecurity: Not on file  Transportation Needs: Not on file  Physical Activity: Not on file  Stress: Not on file  Social Connections: Not on file  Intimate Partner Violence: Not on file     Current Outpatient Medications:    amphetamine-dextroamphetamine (ADDERALL XR) 20 MG 24 hr capsule, Take 1 capsule (20 mg total) by mouth every morning., Disp: 30 capsule, Rfl: 0   amphetamine-dextroamphetamine (ADDERALL XR) 20 MG 24 hr capsule, Take 1 capsule (20 mg total) by mouth every morning., Disp: 30 capsule, Rfl: 0   amphetamine-dextroamphetamine (ADDERALL XR) 20 MG 24 hr capsule, Take 1 capsule (20 mg total) by mouth every morning., Disp: 30 capsule, Rfl: 0   diclofenac (VOLTAREN) 75 MG EC tablet, Take 1 tablet (75 mg total) by mouth 2 (  two) times daily., Disp: 60 tablet, Rfl: 3   escitalopram (LEXAPRO) 10 MG tablet, Take 1 tablet (10 mg total) by mouth daily. Take  x7 days then increase to  daily., Disp: 90 tablet, Rfl: 1   Rimegepant Sulfate (NURTEC) 75 MG TBDP, Take 1 tablet (75 mg total) by mouth every other day. Take for migraine prevention, Disp: 16 tablet, Rfl: 2   rizatriptan (MAXALT-MLT) 10 MG disintegrating tablet, Take 1 tablet (10 mg total) by mouth as needed for migraine. May repeat in 2 hours if needed, Disp: 10 tablet, Rfl: 0   tiZANidine (ZANAFLEX) 4 MG capsule, Take 1 capsule (4 mg total) by mouth 3 (three) times daily as needed for muscle spasms., Disp: 90  capsule, Rfl: 3   valACYclovir (VALTREX) 500 MG tablet, Take 1 tablet (500 mg total) by mouth daily. Take for 5 days as needed for cold sore, Disp: 90 tablet, Rfl: 3  EXAM:  VITALS per patient if applicable: There were no vitals taken for this visit.  GENERAL: alert, oriented, appears well and in no acute distress  HEENT: atraumatic, conjunttiva clear, no obvious abnormalities on inspection of external nose and ears  NECK: normal movements of the head and neck  LUNGS: on inspection no signs of respiratory distress, breathing rate appears normal, no obvious gross SOB, gasping or wheezing  CV: no obvious cyanosis  MS: moves all visible extremities without noticeable abnormality  PSYCH/NEURO: pleasant and cooperative, no obvious depression or anxiety, speech and thought processing grossly intact  ASSESSMENT AND PLAN:  Discussed the following assessment and plan:  Generalized anxiety disorder Stable with lexapro at current strength.  Recommend continuation.   Chronic migraine without aura Recommended that she take nurtec every other day for this to be effective for migraine prevention. She will start this and let me know if this persists after a few weeks.      I discussed the assessment and treatment plan with the patient. The patient was provided an opportunity to ask questions and all were answered. The patient agreed with the plan and demonstrated an understanding of the instructions.   The patient was advised to call back or seek an in-person evaluation if the symptoms worsen or if the condition fails to improve as anticipated.    Everrett Coombe, DO

## 2022-08-20 NOTE — Therapy (Signed)
OUTPATIENT PHYSICAL THERAPY TREATMENT NOTE   Patient Name: Erin Little MRN: [id number] DOB:10/27/1980, 42 y.o., female Today's Date: 08/21/2022   PCP: Everrett Coombe, DO  REFERRING PROVIDER: Monica Becton, MD; Lesly Dukes, MD    END OF SESSION:   PT End of Session - 08/21/22 1538     Visit Number 4    Number of Visits 9    Date for PT Re-Evaluation 09/25/22    Authorization Type [employer]    PT Start Time 1530    PT Stop Time 1615    PT Time Calculation (min) 45 min    Activity Tolerance Patient tolerated treatment well    Behavior During Therapy [facility] for tasks assessed/performed               Past Medical History:  Diagnosis Date   Migraines    Past Surgical History:  Procedure Laterality Date   APPENDECTOMY     ELBOW ARTHROSCOPY     TONSILLECTOMY     Patient Active Problem List   Diagnosis Date Noted   Lateral epicondylitis, right elbow 11/22/2021   Depression, major, single episode, mild 10/01/2020   Plantar fasciitis, right 09/24/2019   Generalized anxiety disorder 06/28/2015   Insomnia 06/28/2015   Recurrent herpes labialis 06/28/2015   ADD (attention deficit disorder) 10/13/2013   Chronic migraine without aura 10/13/2013   Interstitial cystitis (chronic) without hematuria 10/13/2013   Foot pain, bilateral 06/30/2012    REFERRING DIAG: Lateral epicondylitis, right elbow; Chronic nonintractable headache, unspecified headache type   THERAPY DIAG:  Pain in right elbow  Cervicalgia  Muscle weakness (generalized)  Rationale for Evaluation and Treatment Rehabilitation  PERTINENT HISTORY: see PMH above   PRECAUTIONS: none    SUBJECTIVE:                                                                                                                                                                                     SUBJECTIVE STATEMENT:  Patient reports the elbow is slowly improving, she can straighten it better but it does still  bother her. The elbow pain doesn't wake her up at night anymore and she reports that she doesn't really feel anything unless she tries to straighten it. She does have to be careful when lifting or manipulating machinery using the right arm. She does note that she has not been taking the diclofenac since last visit and this has really aggravated her plantar fascia. She does continue to deal with pressure in her neck and head.   PAIN:  Are you having pain? Yes:  NPRS scale: 5/10 when using the right elbow Pain location: Right elbow Pain description: Tender, throbbing  Aggravating factors: Straightening elbow, lifting, carrying with right arm Relieving factors: Medication   NPRS scale: 0/10, 8-10 when tension migraines occur Pain location: Neck, headaches Pain description: Radiating, tightness Aggravating factors: Stress induced and can worsen with rest, prior to menstrual cycle Relieving factors: Medication   OBJECTIVE: (objective measures completed at initial evaluation unless otherwise dated) PATIENT SURVEYS:  FOTO 47% functional status  (elbow)   POSTURE: Rounded shoulder posture, right elbow held in guarded and bend position   CERVICAL ROM:    Active ROM A/PROM (deg) eval 08/08/22 08/21/2022  Flexion 65    Extension 45 50   Right lateral flexion 30  45  Left lateral flexion 30  45  Right rotation 70  70  Left rotation 70  70   (Blank rows = not tested)   UPPER EXTREMITY ROM:    Active ROM Right eval Left eval Right 08/14/2022  Shoulder flexion       Shoulder extension       Shoulder abduction       Shoulder adduction       Shoulder internal rotation       Shoulder external rotation       Elbow flexion 145 145   Elbow extension 10 lacking 15 hyper 5 hyper  Wrist flexion       Wrist extension       Wrist ulnar deviation       Wrist radial deviation       Wrist pronation       Wrist supination       (Blank rows = not tested)   Patient is able to achieve full right  elbow extension PROM   UPPER EXTREMITY MMT:   MMT Right eval Left eval  Shoulder flexion      Shoulder extension      Shoulder abduction      Shoulder adduction      Shoulder internal rotation      Shoulder external rotation      Middle trapezius      Lower trapezius      Elbow flexion 5* 5  Elbow extension 5 5  Wrist flexion 5 5  Wrist extension 4 5  Wrist ulnar deviation 5 5  Wrist radial deviation 4 5  Wrist pronation 5 5  Wrist supination 4 5  Grip strength (lbs) 55 75  (Blank rows = not tested)   Patient with 4/5 MMT and pain with elbow flexion in neutral and pronated position    JOINT MOBILITY TESTING:  Not assessed   PALPATION:  Tender to palpation right proximal wrist extensor muscle group, bilateral upper trap and suboccipital region               TODAY'S TREATMENT:     [facility] Adult PT Treatment:                                                DATE: 08/21/22 Therapeutic Exercise: UBE L1 x 4 min (fwd/bwd) while taking subjective Supine cervical retraction x 10 Seated upper trap and levator scap stretch 2 x 15 sec each Seated shoulder blade squeezes x 5 Review current HEP Manual Therapy: Skilled palpation and monitoring of muscle tension while performing TPDN  Suboccipital release with gentle manual traction STM right wrist extensor muscle group Lateral radiohumeral joint mob, radioulnar mobs Trigger Point Dry  Needling Treatment: Pre-treatment instruction: Patient instructed on dry needling rationale, procedures, and possible side effects including pain during treatment (achy,cramping feeling), bruising, drop of blood, lightheadedness, nausea, sweating. Patient Consent Given: Yes Education handout provided: Yes Muscles treated: Bilateral upper trap and suboccipitals Needle size and number: .30x27mm x 2, .30x30mm x 3 Electrical stimulation performed: No Parameters: N/A Treatment response/outcome: Twitch response elicited and Palpable decrease in muscle  tension Post-treatment instructions: Patient instructed to expect possible mild to moderate muscle soreness later today and/or tomorrow. Patient instructed in methods to reduce muscle soreness and to continue prescribed HEP. If patient was dry needled over the lung field, patient was instructed on signs and symptoms of pneumothorax and, however unlikely, to see immediate medical attention should they occur. Patient was also educated on signs and symptoms of infection and to seek medical attention should they occur. Patient verbalized understanding of these instructions and education. Modalities: Iontophoresis 4 hour extended release patch, 1.48ml dexamethasone, 80 mA-min placed at right elbow wrist extensor muscle group insertion   [facility] Adult PT Treatment:                                                DATE: 08/14/22 Therapeutic Exercise: UBE L1 x 4 min (fwd/bwd) while taking subjective Wrist extension 3# 2 x 10 Therabar wrist extension eccentric x 5 Farmer's carry hold 3 x 30 sec with 30# Pronation / supination with 2# 2 x 10 Manual Therapy: Skilled palpation and monitoring of muscle tension while performing TPDN  STM right wrist extensor muscle group Trigger Point Dry Needling Treatment: Pre-treatment instruction: Patient instructed on dry needling rationale, procedures, and possible side effects including pain during treatment (achy,cramping feeling), bruising, drop of blood, lightheadedness, nausea, sweating. Patient Consent Given: Yes Education handout provided: Yes Muscles treated: Right wrist extensor group Needle size and number: .30x77mm x 2 Electrical stimulation performed: No Parameters: N/A Treatment response/outcome: Twitch response elicited and Palpable decrease in muscle tension Post-treatment instructions: Patient instructed to expect possible mild to moderate muscle soreness later today and/or tomorrow. Patient instructed in methods to reduce muscle soreness and to continue  prescribed HEP. If patient was dry needled over the lung field, patient was instructed on signs and symptoms of pneumothorax and, however unlikely, to see immediate medical attention should they occur. Patient was also educated on signs and symptoms of infection and to seek medical attention should they occur. Patient verbalized understanding of these instructions and education. Modalities: Iontophoresis 4 hour extended release patch, 1.50ml dexamethasone, 80 mA-min placed at right elbow wrist extensor muscle group insertion  [facility] Adult PT Treatment:                                                DATE: 08/08/22 Therapeutic Exercise: Supine cervical retraction x 10; 5 sec hold Cervical extension SNAG upper and lower x 10 each  Upper trap stretch x 20 sec each Levator scap stretch x 20 sec each Updated HEP  Manual Therapy: STM/DTM Rt forearm extensor mass Rt elbow distraction STM bilateral upper traps, cervical paraspinals, levator scapulae Passive upper trap stretching Suboccipital release CPAs grade II-III C2-T2  Trigger Point Dry Needling Treatment: Pre-treatment instruction: Patient instructed on dry needling rationale, procedures, and possible side  effects including pain during treatment (achy,cramping feeling), bruising, drop of blood, lightheadedness, nausea, sweating. Patient Consent Given: Yes Education handout provided: Yes Muscles treated: bilateral upper trap   Needle size and number: .30x72mm x 2 Electrical stimulation performed: No Parameters: N/A Treatment response/outcome: Twitch response elicited and Palpable decrease in muscle tension Post-treatment instructions: Patient instructed to expect possible mild to moderate muscle soreness later today and/or tomorrow. Patient instructed in methods to reduce muscle soreness and to continue prescribed HEP. If patient was dry needled over the lung field, patient was instructed on signs and symptoms of pneumothorax and, however  unlikely, to see immediate medical attention should they occur. Patient was also educated on signs and symptoms of infection and to seek medical attention should they occur. Patient verbalized understanding of these instructions and education. Modalities:  MHP neck x 10 minutes; Ice pack Rt elbow x 10 minutes    PATIENT EDUCATION: Education details: HEP, TPDN, iontophoresis Person educated: Patient Education method: Explanation, Demonstration, Tactile cues, Verbal cues Education comprehension: verbalized understanding, returned demonstration, verbal cues required, tactile cues required, and needs further education   HOME EXERCISE PROGRAM: Access Code: [email or web address]      ASSESSMENT: CLINICAL IMPRESSION: Patient tolerated therapy well with no adverse effects. Continued with TPDN for the cervical region this visit with good therapeutic benefit, patient did report feeling pressure in her neck following treatment. She does demonstrate improve cervical motion this visit. Therapy focused primarily on neck stretching and postural control with good tolerance, and also performed mobilizations for the right elbow with good benefit. Applied dexamethasone ionto patch to right elbow as she reports this was beneficial after last visit. No changes made to HEP this visit. Patient would benefit from continued skilled PT to progress her mobility and strength in order to reduce pain and maximize functional ability.    OBJECTIVE IMPAIRMENTS: decreased activity tolerance, decreased ROM, decreased strength, postural dysfunction, and pain.    ACTIVITY LIMITATIONS: carrying, lifting, sleeping, and hygiene/grooming   PARTICIPATION LIMITATIONS: meal prep, cleaning, laundry, shopping, and occupation   PERSONAL FACTORS: Past/current experiences and Time since onset of injury/illness/exacerbation are also affecting patient's functional outcome.      GOALS: Goals reviewed with patient? Yes   SHORT TERM GOALS: Target  date: 08/28/2022   Patient will be I with initial HEP in order to progress with therapy. Baseline: HEP provided at eval Goal status: INITIAL   2.  PT will review FOTO with patient by 3rd visit in order to understand expected progress and outcome with therapy. Baseline: FOTO assessed at eval Goal status: INITIAL   3.  Patient will report right elbow pain </= 2/10 with activity in order to reduce work related limitations with lifting and grasping tasks Baseline: 6/10 pain Goal status: INITIAL   LONG TERM GOALS: Target date: 09/25/2022   Patient will be I with final HEP to maintain progress from PT. Baseline: HEP provided at eval Goal status: INITIAL   2.  Patient will report >/= 60% status on FOTO to indicate improved functional ability. Baseline: 47% functional status Goal status: INITIAL   3.  Patient will demonstrate right wrist and elbow strength >/= 5/5 MMT in order to improve lifting and holding objects using the right arm Baseline: see limitations noted above Goal status: INITIAL   4.  Patient will report 80% resolution of chronic migraines and neck tension in order to reduce functional limitations and improve sleeping ability Baseline: patient reports frequent migraines and neck tension that reach 8-1/10 pain when  occurs  Goal status: INITIAL     PLAN: PT FREQUENCY: 1x/week   PT DURATION: 8 weeks   PLANNED INTERVENTIONS: Therapeutic exercises, Therapeutic activity, Neuromuscular re-education, Balance training, Gait training, Patient/Family education, Self Care, Joint mobilization, Joint manipulation, Aquatic Therapy, Dry Needling, Electrical stimulation, Spinal mobilization, Cryotherapy, Moist heat, Taping, Ionotophoresis 4mg /ml Dexamethasone, Manual therapy, and Re-evaluation   PLAN FOR NEXT SESSION: Review HEP and progress PRN, manual/dry needling for bilateral upper trap and suboccipital region/and right wrist extensor muscle group, trial mobilization with gripping/wrist  strengthening for pain reduction, progress right elbow and wrist strengthening, postural strengthening and control, trial ionto for right elbow      Rosana Hoes, PT, DPT, LAT, ATC 08/21/22  4:44 PM Phone: [phone] Fax: [phone]

## 2022-08-21 ENCOUNTER — Encounter: Payer: Managed Care, Other (non HMO) | Admitting: Physical Therapy

## 2022-08-21 ENCOUNTER — Encounter: Payer: Self-pay | Admitting: Physical Therapy

## 2022-08-21 ENCOUNTER — Other Ambulatory Visit: Payer: Self-pay

## 2022-08-21 ENCOUNTER — Ambulatory Visit: Payer: Managed Care, Other (non HMO) | Admitting: Physical Therapy

## 2022-08-21 DIAGNOSIS — M25521 Pain in right elbow: Secondary | ICD-10-CM

## 2022-08-21 DIAGNOSIS — M6281 Muscle weakness (generalized): Secondary | ICD-10-CM

## 2022-08-21 DIAGNOSIS — M542 Cervicalgia: Secondary | ICD-10-CM

## 2022-08-28 ENCOUNTER — Ambulatory Visit: Payer: Managed Care, Other (non HMO) | Admitting: Physical Therapy

## 2022-09-09 ENCOUNTER — Ambulatory Visit: Payer: Managed Care, Other (non HMO) | Attending: Sports Medicine

## 2022-09-09 DIAGNOSIS — M25521 Pain in right elbow: Secondary | ICD-10-CM | POA: Insufficient documentation

## 2022-09-09 DIAGNOSIS — M6281 Muscle weakness (generalized): Secondary | ICD-10-CM | POA: Diagnosis present

## 2022-09-09 DIAGNOSIS — M542 Cervicalgia: Secondary | ICD-10-CM | POA: Insufficient documentation

## 2022-09-09 NOTE — Therapy (Signed)
OUTPATIENT PHYSICAL THERAPY TREATMENT NOTE   Patient Name: Erin Little MRN: [id number] DOB:[birth year], 42 y.o., female Today's Date: 09/09/2022   PCP: Everrett Coombe, DO  REFERRING PROVIDER: Monica Becton, MD; Lesly Dukes, MD    END OF SESSION:   PT End of Session - 09/09/22 1147     Visit Number 5    Number of Visits 9    Date for PT Re-Evaluation 09/25/22    Authorization Type [employer]    PT Start Time 1147    PT Stop Time 1233    PT Time Calculation (min) 46 min    Activity Tolerance Patient tolerated treatment well    Behavior During Therapy [facility] for tasks assessed/performed                Past Medical History:  Diagnosis Date   Migraines    Past Surgical History:  Procedure Laterality Date   APPENDECTOMY     ELBOW ARTHROSCOPY     TONSILLECTOMY     Patient Active Problem List   Diagnosis Date Noted   Lateral epicondylitis, right elbow 11/22/2021   Depression, major, single episode, mild (HCC) 10/01/2020   Plantar fasciitis, right 09/24/2019   Generalized anxiety disorder 06/28/2015   Insomnia 06/28/2015   Recurrent herpes labialis 06/28/2015   ADD (attention deficit disorder) 10/13/2013   Chronic migraine without aura 10/13/2013   Interstitial cystitis (chronic) without hematuria 10/13/2013   Foot pain, bilateral 06/30/2012    REFERRING DIAG: Lateral epicondylitis, right elbow; Chronic nonintractable headache, unspecified headache type   THERAPY DIAG:  Pain in right elbow  Cervicalgia  Muscle weakness (generalized)  Rationale for Evaluation and Treatment Rehabilitation  PERTINENT HISTORY: see PMH above   PRECAUTIONS: none    SUBJECTIVE:                                                                                                                                                                                     SUBJECTIVE STATEMENT:  "I have found improvement in both my neck and my arm." She reports migraines are not  happening every day now.    PAIN:  Are you having pain? Yes:  NPRS scale: 5/10 when using the right elbow Pain location: Right elbow Pain description: soreness  Aggravating factors: Straightening elbow, lifting, carrying with right arm Relieving factors: Medication   NPRS scale: 6/10 Pain location: Neck, headaches Pain description: Radiating, tightness Aggravating factors: Stress induced and can worsen with rest, prior to menstrual cycle Relieving factors: Medication   OBJECTIVE: (objective measures completed at initial evaluation unless otherwise dated) PATIENT SURVEYS:  FOTO 47% functional status  (elbow)   POSTURE: Rounded shoulder posture, right  elbow held in guarded and bend position   CERVICAL ROM:    Active ROM A/PROM (deg) eval 08/08/22 08/21/2022  Flexion 65    Extension 45 50   Right lateral flexion 30  45  Left lateral flexion 30  45  Right rotation 70  70  Left rotation 70  70   (Blank rows = not tested)   UPPER EXTREMITY ROM:    Active ROM Right eval Left eval Right 08/14/2022  Shoulder flexion       Shoulder extension       Shoulder abduction       Shoulder adduction       Shoulder internal rotation       Shoulder external rotation       Elbow flexion 145 145   Elbow extension 10 lacking 15 hyper 5 hyper  Wrist flexion       Wrist extension       Wrist ulnar deviation       Wrist radial deviation       Wrist pronation       Wrist supination       (Blank rows = not tested)   Patient is able to achieve full right elbow extension PROM   UPPER EXTREMITY MMT:   MMT Right eval Left eval  Shoulder flexion      Shoulder extension      Shoulder abduction      Shoulder adduction      Shoulder internal rotation      Shoulder external rotation      Middle trapezius      Lower trapezius      Elbow flexion 5* 5  Elbow extension 5 5  Wrist flexion 5 5  Wrist extension 4 5  Wrist ulnar deviation 5 5  Wrist radial deviation 4 5  Wrist pronation  5 5  Wrist supination 4 5  Grip strength (lbs) 55 75  (Blank rows = not tested)   Patient with 4/5 MMT and pain with elbow flexion in neutral and pronated position    JOINT MOBILITY TESTING:  Not assessed   PALPATION:  Tender to palpation right proximal wrist extensor muscle group, bilateral upper trap and suboccipital region               TODAY'S TREATMENT:     [facility] Adult PT Treatment:                                                DATE: 09/09/22 Therapeutic Exercise: UBE level 1 x 2 min each fwd/bwd  Cat/cow x 10  Bilateral ER red band 2 x 10  Resisted shoulder extension red band 2 x 10  Standing Cervical retraction into ball x 10  Updated HEP  Manual Therapy: STM bilateral upper traps,cervical paraspinals Suboccipital release Upper/middle T-spine CPA grade V; attempted J-thrust manipulation to lower C-spine/upper T-spine.   Trigger Point Dry Needling Treatment: Pre-treatment instruction: Patient instructed on dry needling rationale, procedures, and possible side effects including pain during treatment (achy,cramping feeling), bruising, drop of blood, lightheadedness, nausea, sweating. Patient Consent Given: Yes Education handout provided: Previously provided Muscles treated: cervical paraspinals   Needle size and number: .25x69mm x 1 Electrical stimulation performed: No Parameters: N/A Treatment response/outcome: Twitch response elicited and Palpable decrease in muscle tension Post-treatment instructions: Patient instructed to expect possible mild to  moderate muscle soreness later today and/or tomorrow. Patient instructed in methods to reduce muscle soreness and to continue prescribed HEP. If patient was dry needled over the lung field, patient was instructed on signs and symptoms of pneumothorax and, however unlikely, to see immediate medical attention should they occur. Patient was also educated on signs and symptoms of infection and to seek medical attention should they  occur. Patient verbalized understanding of these instructions and education.  Iontophoresis   1 mL dex 4 hours, slow release patch, Rt right elbow wrist extensor muscle group insertion     [facility] Adult PT Treatment:                                                DATE: 08/21/22 Therapeutic Exercise: UBE L1 x 4 min (fwd/bwd) while taking subjective Supine cervical retraction x 10 Seated upper trap and levator scap stretch 2 x 15 sec each Seated shoulder blade squeezes x 5 Review current HEP Manual Therapy: Skilled palpation and monitoring of muscle tension while performing TPDN  Suboccipital release with gentle manual traction STM right wrist extensor muscle group Lateral radiohumeral joint mob, radioulnar mobs Trigger Point Dry Needling Treatment: Pre-treatment instruction: Patient instructed on dry needling rationale, procedures, and possible side effects including pain during treatment (achy,cramping feeling), bruising, drop of blood, lightheadedness, nausea, sweating. Patient Consent Given: Yes Education handout provided: Yes Muscles treated: Bilateral upper trap and suboccipitals Needle size and number: .30x13mm x 2, .30x73mm x 3 Electrical stimulation performed: No Parameters: N/A Treatment response/outcome: Twitch response elicited and Palpable decrease in muscle tension Post-treatment instructions: Patient instructed to expect possible mild to moderate muscle soreness later today and/or tomorrow. Patient instructed in methods to reduce muscle soreness and to continue prescribed HEP. If patient was dry needled over the lung field, patient was instructed on signs and symptoms of pneumothorax and, however unlikely, to see immediate medical attention should they occur. Patient was also educated on signs and symptoms of infection and to seek medical attention should they occur. Patient verbalized understanding of these instructions and education. Modalities: Iontophoresis 4 hour extended  release patch, 1.45ml dexamethasone, 80 mA-min placed at right elbow wrist extensor muscle group insertion   [facility] Adult PT Treatment:                                                DATE: 08/14/22 Therapeutic Exercise: UBE L1 x 4 min (fwd/bwd) while taking subjective Wrist extension 3# 2 x 10 Therabar wrist extension eccentric x 5 Farmer's carry hold 3 x 30 sec with 30# Pronation / supination with 2# 2 x 10 Manual Therapy: Skilled palpation and monitoring of muscle tension while performing TPDN  STM right wrist extensor muscle group Trigger Point Dry Needling Treatment: Pre-treatment instruction: Patient instructed on dry needling rationale, procedures, and possible side effects including pain during treatment (achy,cramping feeling), bruising, drop of blood, lightheadedness, nausea, sweating. Patient Consent Given: Yes Education handout provided: Yes Muscles treated: Right wrist extensor group Needle size and number: .30x31mm x 2 Electrical stimulation performed: No Parameters: N/A Treatment response/outcome: Twitch response elicited and Palpable decrease in muscle tension Post-treatment instructions: Patient instructed to expect possible mild to moderate muscle soreness later today and/or tomorrow. Patient instructed  in methods to reduce muscle soreness and to continue prescribed HEP. If patient was dry needled over the lung field, patient was instructed on signs and symptoms of pneumothorax and, however unlikely, to see immediate medical attention should they occur. Patient was also educated on signs and symptoms of infection and to seek medical attention should they occur. Patient verbalized understanding of these instructions and education. Modalities: Iontophoresis 4 hour extended release patch, 1.51ml dexamethasone, 80 mA-min placed at right elbow wrist extensor muscle group insertion    PATIENT EDUCATION: Education details: HEP, TPDN, iontophoresis; FOTO score  Person educated:  Patient Education method: Explanation, Demonstration, Tactile cues, Verbal cues, handout  Education comprehension: verbalized understanding, returned demonstration, verbal cues required, tactile cues required, and needs further education   HOME EXERCISE PROGRAM: Access Code: [email or web address]      ASSESSMENT: CLINICAL IMPRESSION: Patient tolerated therapy well with no adverse effects. Continued with TPDN for the cervical region with good tolerance. She is noted to have hypomobility about the lower cervical and mid/upper T-spine with cavitation noted with T-spine manipulation. Focused on trunk mobility and postural strengthening today with patient requiring frequent cues to reduce excessive upper thoracic extension with strengthening exercises. Iontophoresis was again utilized to Rt forearm extensor mass as patient reports this has continued to be beneficial for her pain. HEP was updated to include postural strengthening and trunk mobility.     OBJECTIVE IMPAIRMENTS: decreased activity tolerance, decreased ROM, decreased strength, postural dysfunction, and pain.    ACTIVITY LIMITATIONS: carrying, lifting, sleeping, and hygiene/grooming   PARTICIPATION LIMITATIONS: meal prep, cleaning, laundry, shopping, and occupation   PERSONAL FACTORS: Past/current experiences and Time since onset of injury/illness/exacerbation are also affecting patient's functional outcome.      GOALS: Goals reviewed with patient? Yes   SHORT TERM GOALS: Target date: 08/28/2022   Patient will be I with initial HEP in order to progress with therapy. Baseline: HEP provided at eval Goal status: met   2.  PT will review FOTO with patient by 3rd visit in order to understand expected progress and outcome with therapy. Baseline: FOTO assessed at eval Goal status: met   3.  Patient will report right elbow pain </= 2/10 with activity in order to reduce work related limitations with lifting and grasping tasks Baseline: 6/10  pain 09/09/22: 5/10  Goal status: ongoing    LONG TERM GOALS: Target date: 09/25/2022   Patient will be I with final HEP to maintain progress from PT. Baseline: HEP provided at eval Goal status: INITIAL   2.  Patient will report >/= 60% status on FOTO to indicate improved functional ability. Baseline: 47% functional status Goal status: INITIAL   3.  Patient will demonstrate right wrist and elbow strength >/= 5/5 MMT in order to improve lifting and holding objects using the right arm Baseline: see limitations noted above Goal status: INITIAL   4.  Patient will report 80% resolution of chronic migraines and neck tension in order to reduce functional limitations and improve sleeping ability Baseline: patient reports frequent migraines and neck tension that reach 8-1/10 pain when occurs  Goal status: INITIAL     PLAN: PT FREQUENCY: 1x/week   PT DURATION: 8 weeks   PLANNED INTERVENTIONS: Therapeutic exercises, Therapeutic activity, Neuromuscular re-education, Balance training, Gait training, Patient/Family education, Self Care, Joint mobilization, Joint manipulation, Aquatic Therapy, Dry Needling, Electrical stimulation, Spinal mobilization, Cryotherapy, Moist heat, Taping, Ionotophoresis 4mg /ml Dexamethasone, Manual therapy, and Re-evaluation   PLAN FOR NEXT SESSION: Review HEP and progress PRN, manual/dry  needling for bilateral upper trap and suboccipital region/and right wrist extensor muscle group, trial mobilization with gripping/wrist strengthening for pain reduction, progress right elbow and wrist strengthening, postural strengthening and control,  Letitia Libra, PT, DPT, ATC 09/09/22 12:45 PM

## 2022-09-19 ENCOUNTER — Ambulatory Visit: Payer: Managed Care, Other (non HMO)

## 2022-09-19 DIAGNOSIS — M6281 Muscle weakness (generalized): Secondary | ICD-10-CM

## 2022-09-19 DIAGNOSIS — M25521 Pain in right elbow: Secondary | ICD-10-CM

## 2022-09-19 DIAGNOSIS — M542 Cervicalgia: Secondary | ICD-10-CM

## 2022-09-19 NOTE — Therapy (Signed)
OUTPATIENT PHYSICAL THERAPY TREATMENT NOTE  PHYSICAL THERAPY DISCHARGE SUMMARY  Visits from Start of Care: 6  Current functional level related to goals / functional outcomes: See goals below    Remaining deficits: Intermittent Rt elbow soreness Occasional migraine    Education / Equipment: See education below    Patient agrees to discharge. Patient goals were met. Patient is being discharged due to meeting the stated rehab goals.  Patient Name: Erin Little MRN: [id number] DOB:1980-07-03, 42 y.o., female Today's Date: 09/19/2022   PCP: Everrett Coombe, DO  REFERRING PROVIDER: Monica Becton, MD; Lesly Dukes, MD    END OF SESSION:   PT End of Session - 09/19/22 1239     Visit Number 6    Number of Visits 9    Date for PT Re-Evaluation 09/25/22    Authorization Type [employer]    PT Start Time 1238    PT Stop Time 1316    PT Time Calculation (min) 38 min    Activity Tolerance Patient tolerated treatment well    Behavior During Therapy [facility] for tasks assessed/performed                 Past Medical History:  Diagnosis Date   Migraines    Past Surgical History:  Procedure Laterality Date   APPENDECTOMY     ELBOW ARTHROSCOPY     TONSILLECTOMY     Patient Active Problem List   Diagnosis Date Noted   Lateral epicondylitis, right elbow 11/22/2021   Depression, major, single episode, mild (HCC) 10/01/2020   Plantar fasciitis, right 09/24/2019   Generalized anxiety disorder 06/28/2015   Insomnia 06/28/2015   Recurrent herpes labialis 06/28/2015   ADD (attention deficit disorder) 10/13/2013   Chronic migraine without aura 10/13/2013   Interstitial cystitis (chronic) without hematuria 10/13/2013   Foot pain, bilateral 06/30/2012    REFERRING DIAG: Lateral epicondylitis, right elbow; Chronic nonintractable headache, unspecified headache type   THERAPY DIAG:  Pain in right elbow  Cervicalgia  Muscle weakness (generalized)  Rationale for  Evaluation and Treatment Rehabilitation  PERTINENT HISTORY: see PMH above   PRECAUTIONS: none    SUBJECTIVE:                                                                                                                                                                                     SUBJECTIVE STATEMENT:  "I had a crazy migraine yesterday. It woke me up in my sleep." She reports overall the migraines aren't as frequent since starting PT with one severe migraine a week. Patient reports the elbow is better and occasionally locks and doesn't feel painful, just sore. Patient feels that she  is ready for discharge.   PAIN:  Are you having pain? None currently;  NPRS scale: 3/10 at worst Pain location: Right elbow Pain description: soreness  Aggravating factors: Straightening elbow, lifting, carrying with right arm Relieving factors: Medication   NPRS scale: 4/10 Pain location: Neck, headaches Pain description: tightness  Aggravating factors: Stress induced and can worsen with rest, prior to menstrual cycle Relieving factors: Medication   OBJECTIVE: (objective measures completed at initial evaluation unless otherwise dated) PATIENT SURVEYS:  FOTO 47% functional status  (elbow)  09/19/22: 60% [place]    POSTURE: Rounded shoulder posture, right elbow held in guarded and bend position   CERVICAL ROM:    Active ROM A/PROM (deg) eval 08/08/22 08/21/2022 09/19/22  Flexion 65   70  Extension 45 50  62  Right lateral flexion 30  45   Left lateral flexion 30  45   Right rotation 70  70 80  Left rotation 70  70 75   (Blank rows = not tested)   UPPER EXTREMITY ROM:    Active ROM Right eval Left eval Right 08/14/2022  Shoulder flexion       Shoulder extension       Shoulder abduction       Shoulder adduction       Shoulder internal rotation       Shoulder external rotation       Elbow flexion 145 145   Elbow extension 10 lacking 15 hyper 5 hyper  Wrist flexion       Wrist  extension       Wrist ulnar deviation       Wrist radial deviation       Wrist pronation       Wrist supination       (Blank rows = not tested)   Patient is able to achieve full right elbow extension PROM   UPPER EXTREMITY MMT:   MMT Right eval Left eval 09/19/22 Right   Shoulder flexion       Shoulder extension       Shoulder abduction       Shoulder adduction       Shoulder internal rotation       Shoulder external rotation       Middle trapezius       Lower trapezius       Elbow flexion 5* 5 5  Elbow extension 5 5   Wrist flexion 5 5 5   Wrist extension 4 5 5   Wrist ulnar deviation 5 5   Wrist radial deviation 4 5 5   Wrist pronation 5 5 5   Wrist supination 4 5 5   Grip strength (lbs) 55 75 60  (Blank rows = not tested)   Patient with 4/5 MMT and pain with elbow flexion in neutral and pronated position    JOINT MOBILITY TESTING:  Not assessed   PALPATION:  Tender to palpation right proximal wrist extensor muscle group, bilateral upper trap and suboccipital region               TODAY'S TREATMENT:     [facility] Adult PT Treatment:                                                DATE: 09/19/22 Therapeutic Exercise: Demo and returned demo of advanced HEP, discussing frequency, sets, reps and ways to  progress Exercises - Seated Wrist Extension with Anchored Resistance  - 3 x weekly - 2 sets - 10 reps - Seated Wrist Radial Deviation with Anchored Resistance  - 1 x daily - 3 x weekly - 2 sets - 10 reps - Forearm Pronation and Supination with Hammer  - 3 x weekly - 2 sets - 10 reps - Standing Single Arm Elbow Flexion with Resistance  - 1 x daily - 3 x weekly - 2 sets - 10 reps - Putty Squeezes  - 1 x daily - 7 x weekly - 1 sets - 10 reps - Supine Chin Tuck  - 1 x daily - 7 x weekly - 2 sets - 10 reps - 5 sec  hold - Mid-Lower Cervical Extension SNAG with Strap  - 1 x daily - 7 x weekly - 1 sets - 10 reps - Upper Cervical Extension SNAG with Strap  - 1 x daily - 7 x weekly - 1  sets - 10 reps - Seated Upper Trapezius Stretch  - 1 x daily - 7 x weekly - 3 sets - 30 sec hold - Seated Levator Scapulae Stretch  - 1 x daily - 7 x weekly - 3 sets - 30 sec  hold - Cat Cow  - 1 x daily - 7 x weekly - 2 sets - 10 reps - Shoulder External Rotation and Scapular Retraction with Resistance  - 1 x daily - 7 x weekly - 2 sets - 10 reps  Therapeutic Activity: Re-assessment to determine overall progress, educating patient on progress towards goals.   Self Care: Modalities for pain control Theracane for soft tissue mobilization  Posture education    [facility] Adult PT Treatment:                                                DATE: 09/09/22 Therapeutic Exercise: UBE level 1 x 2 min each fwd/bwd  Cat/cow x 10  Bilateral ER red band 2 x 10  Resisted shoulder extension red band 2 x 10  Standing Cervical retraction into ball x 10  Updated HEP  Manual Therapy: STM bilateral upper traps,cervical paraspinals Suboccipital release Upper/middle T-spine CPA grade V; attempted J-thrust manipulation to lower C-spine/upper T-spine.   Trigger Point Dry Needling Treatment: Pre-treatment instruction: Patient instructed on dry needling rationale, procedures, and possible side effects including pain during treatment (achy,cramping feeling), bruising, drop of blood, lightheadedness, nausea, sweating. Patient Consent Given: Yes Education handout provided: Previously provided Muscles treated: cervical paraspinals   Needle size and number: .25x63mm x 1 Electrical stimulation performed: No Parameters: N/A Treatment response/outcome: Twitch response elicited and Palpable decrease in muscle tension Post-treatment instructions: Patient instructed to expect possible mild to moderate muscle soreness later today and/or tomorrow. Patient instructed in methods to reduce muscle soreness and to continue prescribed HEP. If patient was dry needled over the lung field, patient was instructed on signs and symptoms  of pneumothorax and, however unlikely, to see immediate medical attention should they occur. Patient was also educated on signs and symptoms of infection and to seek medical attention should they occur. Patient verbalized understanding of these instructions and education.  Iontophoresis   1 mL dex 4 hours, slow release patch, Rt right elbow wrist extensor muscle group insertion     [facility] Adult PT Treatment:  DATE: 08/21/22 Therapeutic Exercise: UBE L1 x 4 min (fwd/bwd) while taking subjective Supine cervical retraction x 10 Seated upper trap and levator scap stretch 2 x 15 sec each Seated shoulder blade squeezes x 5 Review current HEP Manual Therapy: Skilled palpation and monitoring of muscle tension while performing TPDN  Suboccipital release with gentle manual traction STM right wrist extensor muscle group Lateral radiohumeral joint mob, radioulnar mobs Trigger Point Dry Needling Treatment: Pre-treatment instruction: Patient instructed on dry needling rationale, procedures, and possible side effects including pain during treatment (achy,cramping feeling), bruising, drop of blood, lightheadedness, nausea, sweating. Patient Consent Given: Yes Education handout provided: Yes Muscles treated: Bilateral upper trap and suboccipitals Needle size and number: .30x38mm x 2, .30x59mm x 3 Electrical stimulation performed: No Parameters: N/A Treatment response/outcome: Twitch response elicited and Palpable decrease in muscle tension Post-treatment instructions: Patient instructed to expect possible mild to moderate muscle soreness later today and/or tomorrow. Patient instructed in methods to reduce muscle soreness and to continue prescribed HEP. If patient was dry needled over the lung field, patient was instructed on signs and symptoms of pneumothorax and, however unlikely, to see immediate medical attention should they occur. Patient was also educated  on signs and symptoms of infection and to seek medical attention should they occur. Patient verbalized understanding of these instructions and education. Modalities: Iontophoresis 4 hour extended release patch, 1.14ml dexamethasone, 80 mA-min placed at right elbow wrist extensor muscle group insertion      PATIENT EDUCATION: Education details: see treatment; d/c education, cash-based TPDN location in [place]  Person educated: Patient Education method: Programmer, multimedia, Facilities manager, cues, handout Education comprehension: verbalized understanding, returned demonstration,   HOME EXERCISE PROGRAM: Access Code: [email or web address]      ASSESSMENT: CLINICAL IMPRESSION: Patient has progressed well throughout duration of care reporting an overall improvement in her migraines and Rt elbow pain. She reports instances of about 1 intense migraine weekly at this time and mild occasional "soreness" in the Rt elbow. She demonstrates normalized and pain free cervical AROM as well as full Rt elbow and wrist strength. She has met all established functional goals and demonstrates independence with advanced home program. She is therefore appropriate for discharge at this time with patient in agreement with this plan.     OBJECTIVE IMPAIRMENTS: decreased activity tolerance, decreased ROM, decreased strength, postural dysfunction, and pain.    ACTIVITY LIMITATIONS: carrying, lifting, sleeping, and hygiene/grooming   PARTICIPATION LIMITATIONS: meal prep, cleaning, laundry, shopping, and occupation   PERSONAL FACTORS: Past/current experiences and Time since onset of injury/illness/exacerbation are also affecting patient's functional outcome.      GOALS: Goals reviewed with patient? Yes   SHORT TERM GOALS: Target date: 08/28/2022   Patient will be I with initial HEP in order to progress with therapy. Baseline: HEP provided at eval Goal status: met   2.  PT will review FOTO with patient by 3rd visit in order to  understand expected progress and outcome with therapy. Baseline: FOTO assessed at eval Goal status: met   3.  Patient will report right elbow pain </= 2/10 with activity in order to reduce work related limitations with lifting and grasping tasks Baseline: 6/10 pain 09/09/22: 5/10  Goal status: met   LONG TERM GOALS: Target date: 09/25/2022   Patient will be I with final HEP to maintain progress from PT. Baseline: HEP provided at eval Goal status: met   2.  Patient will report >/= 60% status on FOTO to indicate improved functional ability. Baseline: 47% functional status  Goal status: met   3.  Patient will demonstrate right wrist and elbow strength >/= 5/5 MMT in order to improve lifting and holding objects using the right arm Baseline: see limitations noted above Goal status: met   4.  Patient will report 80% resolution of chronic migraines and neck tension in order to reduce functional limitations and improve sleeping ability Baseline: patient reports frequent migraines and neck tension that reach 8-1/10 pain when occurs  09/19/22: patient reports 1 severe migraine weekly  Goal status: met     PLAN: PT FREQUENCY: n/a   PT DURATION: n/a   PLANNED INTERVENTIONS: Therapeutic exercises, Therapeutic activity, Neuromuscular re-education, Balance training, Gait training, Patient/Family education, Self Care, Joint mobilization, Joint manipulation, Aquatic Therapy, Dry Needling, Electrical stimulation, Spinal mobilization, Cryotherapy, Moist heat, Taping, Ionotophoresis 4mg /ml Dexamethasone, Manual therapy, and Re-evaluation   PLAN FOR NEXT SESSION: n/a   Letitia Libra, PT, DPT, ATC 09/19/22 1:36 PM

## 2022-10-04 ENCOUNTER — Ambulatory Visit (INDEPENDENT_AMBULATORY_CARE_PROVIDER_SITE_OTHER): Payer: Managed Care, Other (non HMO) | Admitting: Medical-Surgical

## 2022-10-04 ENCOUNTER — Encounter: Payer: Self-pay | Admitting: Obstetrics & Gynecology

## 2022-10-04 ENCOUNTER — Encounter: Payer: Self-pay | Admitting: Family Medicine

## 2022-10-04 ENCOUNTER — Encounter: Payer: Self-pay | Admitting: Medical-Surgical

## 2022-10-04 ENCOUNTER — Other Ambulatory Visit: Payer: Self-pay | Admitting: Family Medicine

## 2022-10-04 VITALS — BP 127/82 | HR 97 | Resp 20 | Ht 65.0 in | Wt 204.7 lb

## 2022-10-04 DIAGNOSIS — R300 Dysuria: Secondary | ICD-10-CM

## 2022-10-04 LAB — POCT URINALYSIS DIP (CLINITEK)
Bilirubin, UA: NEGATIVE
Glucose, UA: NEGATIVE mg/dL
Nitrite, UA: NEGATIVE
POC PROTEIN,UA: NEGATIVE
Spec Grav, UA: 1.025 (ref 1.010–1.025)
Urobilinogen, UA: 0.2 E.U./dL
pH, UA: 6 (ref 5.0–8.0)

## 2022-10-04 MED ORDER — PHENAZOPYRIDINE HCL 200 MG PO TABS: 200.0000 mg | ORAL_TABLET | Freq: Three times a day (TID) | ORAL | 0 refills | Status: DC | PRN

## 2022-10-04 MED ORDER — AMPHETAMINE-DEXTROAMPHET ER 20 MG PO CP24: 20.0000 mg | ORAL_CAPSULE | ORAL | 0 refills | Status: DC

## 2022-10-04 MED ORDER — CIPROFLOXACIN HCL 500 MG PO TABS: 500.0000 mg | ORAL_TABLET | Freq: Two times a day (BID) | ORAL | 0 refills | Status: DC

## 2022-10-04 NOTE — Telephone Encounter (Signed)
Patient scheduled.

## 2022-10-04 NOTE — Progress Notes (Signed)
        Established patient visit  History, exam, impression, and plan:  1. Dysuria Pleasant 42 year old female presenting today with reports of approximately 5 days of dysuria including frequency, urgency, suprapubic pain/tenderness, and pain at the end of urination.  Endorses bladder spasms over the last several days.  Has been pushing fluids but has not seen any improvement in symptoms.  Notable for a history of interstitial cystitis but has not had any issues with this in quite a few years.  Has not tried any over-the-counter medications for her symptoms.  Denies fever, chills, nausea, vomiting, back pain, flank pain, and hematuria.  POCT urinalysis negative for nitrites but positive for trace lysed blood and trace leukocytes.  Sending for culture.  Has not had any abnormal vaginal symptoms or odor.  Sending stat wet prep for further evaluation.  Discussed possible etiologies for her symptoms including an interstitial cystitis flare, bacterial vaginosis, or UTI.  Treating with Pyridium 200 mg every 8 hours as needed.   Push fluids and practice good bladder hygiene.  Adding Cipro for empiric treatment if symptoms worsen over the weekend.  If symptoms are well-managed with Pyridium and pushing fluids, hold off on starting the antibiotic until her culture comes back.  Patient verbalized understanding and is agreeable to the plan. - POCT URINALYSIS DIP (CLINITEK) - Urine Culture - WET PREP FOR TRICH, YEAST, CLUE   Procedures performed this visit: None.  Return if symptoms worsen or fail to improve.  __________________________________ Thayer Ohm, DNP, APRN, FNP-BC Primary Care and Sports Medicine [facility] [place]

## 2022-10-05 LAB — WET PREP FOR TRICH, YEAST, CLUE
MICRO NUMBER:: 15055592
Specimen Quality: ADEQUATE

## 2022-10-06 LAB — URINE CULTURE
MICRO NUMBER:: 15055593
SPECIMEN QUALITY:: ADEQUATE

## 2022-12-02 ENCOUNTER — Encounter: Payer: Self-pay | Admitting: Family Medicine

## 2022-12-03 MED ORDER — NURTEC 75 MG PO TBDP: 1.0000 | ORAL_TABLET | ORAL | 2 refills | Status: AC

## 2022-12-10 ENCOUNTER — Ambulatory Visit: Payer: Managed Care, Other (non HMO) | Admitting: Sports Medicine

## 2022-12-19 ENCOUNTER — Ambulatory Visit: Payer: Managed Care, Other (non HMO) | Admitting: Sports Medicine

## 2023-01-10 ENCOUNTER — Encounter: Payer: Self-pay | Admitting: Family Medicine

## 2023-01-10 DIAGNOSIS — L509 Urticaria, unspecified: Secondary | ICD-10-CM

## 2023-01-10 NOTE — Telephone Encounter (Signed)
Pended referral to allergist.

## 2023-01-30 LAB — LAB REPORT - SCANNED: EGFR: 113

## 2023-02-11 ENCOUNTER — Ambulatory Visit: Payer: Managed Care, Other (non HMO) | Admitting: Internal Medicine

## 2023-03-11 ENCOUNTER — Telehealth: Payer: Self-pay | Admitting: *Deleted

## 2023-03-11 NOTE — Telephone Encounter (Signed)
Left patient a message to call and schedule annual. 

## 2023-03-13 ENCOUNTER — Ambulatory Visit
Admission: RE | Admit: 2023-03-13 | Discharge: 2023-03-13 | Disposition: A | Discharge status: Home / Self Care | Payer: Managed Care, Other (non HMO) | Source: Ambulatory Visit

## 2023-03-13 VITALS — BP 127/80 | HR 84 | Temp 98.5°F | Resp 18 | Ht 64.0 in | Wt 216.0 lb

## 2023-03-13 DIAGNOSIS — J3089 Other allergic rhinitis: Secondary | ICD-10-CM

## 2023-03-13 DIAGNOSIS — J0140 Acute pansinusitis, unspecified: Secondary | ICD-10-CM

## 2023-03-13 LAB — POCT RAPID STREP A (OFFICE): Rapid Strep A Screen: NEGATIVE

## 2023-03-13 MED ORDER — PREDNISONE 20 MG PO TABS: 20.0000 mg | ORAL_TABLET | Freq: Two times a day (BID) | ORAL | 0 refills | Status: DC

## 2023-03-13 MED ORDER — GUAIFENESIN ER 600 MG PO TB12
600.0000 mg | ORAL_TABLET | Freq: Two times a day (BID) | ORAL | 0 refills | Status: DC
Start: 2023-03-13 — End: 2023-12-03

## 2023-03-13 MED ORDER — DOXYCYCLINE HYCLATE 100 MG PO CAPS: 100.0000 mg | ORAL_CAPSULE | Freq: Two times a day (BID) | ORAL | 0 refills | Status: DC

## 2023-03-13 NOTE — ED Triage Notes (Signed)
Patient c/o sore throat x 2 days, nasal drainage, worse yesterday.  Denies fever, sinus pressure and headache.  Patient has taken Sudafed.

## 2023-03-13 NOTE — ED Provider Notes (Signed)
Erin Little [facility]    CSN: [id number] Arrival date & time: 03/13/23  0814      History   Chief Complaint Chief Complaint  Patient presents with   Sore Throat    Entered by patient    HPI [name] is a 42 y.o. female.   HPI  Patient states she has a severe sore throat.  Has a history of allergies.  Is also having some sinus pressure and, runny nose, and headache.  States that the sore throat is severely painful "like swallowing razors".  She request strep testing.  No fever or chills.  She does feel very tired  Past Medical History:  Diagnosis Date   Migraines     Patient Active Problem List   Diagnosis Date Noted   Lateral epicondylitis, right elbow 11/22/2021   Depression, major, single episode, mild (HCC) 10/01/2020   Plantar fasciitis, right 09/24/2019   Generalized anxiety disorder 06/28/2015   Insomnia 06/28/2015   Recurrent herpes labialis 06/28/2015   Atypical squamous cell changes of undetermined significance (ASCUS) on vaginal cytology 06/28/2015   Calculus of left kidney 06/28/2015   Episodic tension-type headache, not intractable 06/28/2015   Irregular menses 06/28/2015   Obesity 06/28/2015   Recurrent UTI 06/28/2015   ADD (attention deficit disorder) 10/13/2013   Chronic migraine without aura 10/13/2013   Interstitial cystitis (chronic) without hematuria 10/13/2013   Increased frequency of urination 10/13/2013   Lower abdominal pain 10/13/2013   Midline low back pain without sciatica 10/13/2013   Urge incontinence of urine 10/13/2013   Urinary hesitancy 10/13/2013   Foot pain, bilateral 06/30/2012   Accumulation of fluid in tissues 06/30/2012   Foot pain 06/30/2012   Migraine, unspecified, not intractable, without status migrainosus 07/01/2011    Past Surgical History:  Procedure Laterality Date   APPENDECTOMY     ELBOW ARTHROSCOPY     TONSILLECTOMY      OB History     Gravida  2   Para  2   Term  2   Preterm      AB       Living  2      SAB      IAB      Ectopic      Multiple      Live Births               Home Medications    Prior to Admission medications   Medication Sig Start Date End Date Taking? Authorizing Provider  cetirizine (ZYRTEC) 10 MG tablet Take 10 mg by mouth daily. 01/29/23  Yes [provider]  doxycycline (VIBRAMYCIN) 100 MG capsule Take 1 capsule (100 mg total) by mouth 2 (two) times daily. 03/13/23  Yes Eustace Moore, MD  EPINEPHrine 0.3 mg/0.3 mL IJ SOAJ injection Inject 0.3 mg into the muscle as needed. 01/29/23  Yes [provider]  famotidine (PEPCID) 20 MG tablet Take 20 mg by mouth 2 (two) times daily. 01/29/23  Yes [provider]  guaiFENesin (MUCINEX) 600 MG 12 hr tablet Take 1 tablet (600 mg total) by mouth 2 (two) times daily. 03/13/23  Yes Eustace Moore, MD  predniSONE (DELTASONE) 20 MG tablet Take 1 tablet (20 mg total) by mouth 2 (two) times daily with a meal. 03/13/23  Yes Eustace Moore, MD  Rimegepant Sulfate (NURTEC) 75 MG TBDP Take 1 tablet (75 mg total) by mouth every other day. Take for migraine prevention 12/03/22  Yes Everrett Coombe, DO  rizatriptan (MAXALT-MLT) 10 MG disintegrating tablet Take 1 tablet (10 mg total) by mouth as needed for migraine. May repeat in 2 hours if needed 08/09/22  Yes Ashley Royalty, Selena Batten, DO  valACYclovir (VALTREX) 500 MG tablet Take 1 tablet (500 mg total) by mouth daily. Take for 5 days as needed for cold sore 12/25/21  Yes Everrett Coombe, DO    Family History Family History  Problem Relation Age of Onset   Parkinson's disease Mother    Alzheimer's disease Father     Social History Social History   Tobacco Use   Smoking status: Never   Smokeless tobacco: Never  Vaping Use   Vaping status: Never Used  Substance Use Topics   Alcohol use: Not Currently   Drug use: Not Currently     Allergies   Latex, Sulfamethoxazole-trimethoprim, Cephalexin, and Nitrofurantoin   Review  of Systems Review of Systems See HPI  Physical Exam Triage Vital Signs ED Triage Vitals  Encounter Vitals Group     BP 03/13/23 0828 127/80     Systolic BP Percentile --      Diastolic BP Percentile --      Pulse Rate 03/13/23 0828 84     Resp 03/13/23 0828 18     Temp 03/13/23 0828 98.5 F (36.9 C)     Temp Source 03/13/23 0828 Oral     SpO2 03/13/23 0828 99 %     Weight 03/13/23 0829 216 lb (98 kg)     Height 03/13/23 0829 5\' 4"  (1.626 m)     Head Circumference --      Peak Flow --      Pain Score 03/13/23 0829 8     Pain Loc --      Pain Education --      Exclude from Growth Chart --    No data found.  Updated Vital Signs BP 127/80 (BP Location: Right Arm)   Pulse 84   Temp 98.5 F (36.9 C) (Oral)   Resp 18   Ht 5\' 4"  (1.626 m)   Wt 98 kg   LMP 03/06/2023   SpO2 99%   BMI 37.08 kg/m      Physical Exam Vitals reviewed.  Constitutional:      General: She is not in acute distress.    Appearance: She is well-developed. She is ill-appearing.  HENT:     Head: Normocephalic and atraumatic.     Right Ear: Tympanic membrane and ear canal normal.     Left Ear: Tympanic membrane and ear canal normal.     Nose: Congestion and rhinorrhea present.     Mouth/Throat:     Mouth: Mucous membranes are moist.     Pharynx: Posterior oropharyngeal erythema present.     Comments: Visible thick yellow drainage in posterior pharynx Eyes:     Conjunctiva/sclera: Conjunctivae normal.     Pupils: Pupils are equal, round, and reactive to light.  Cardiovascular:     Rate and Rhythm: Normal rate and regular rhythm.     Heart sounds: Normal heart sounds.  Pulmonary:     Effort: Pulmonary effort is normal. No respiratory distress.     Breath sounds: Normal breath sounds.  Abdominal:     General: There is no distension.     Palpations: Abdomen is soft.  Musculoskeletal:        General: Normal range of motion.     Cervical back: Normal range of motion.  Skin:    General: Skin  is warm  and dry.  Neurological:     Mental Status: She is alert.      UC Treatments / Results  Labs (all labs ordered are listed, but only abnormal results are displayed) Labs Reviewed  CULTURE, GROUP A STREP [facility])  POCT RAPID STREP A (OFFICE)   Strep test is negative EKG   Radiology No results found.  Procedures Procedures (including critical care time)  Medications Ordered in UC Medications - No data to display  Initial Impression / Assessment and Plan / UC Course  I have reviewed the triage vital signs and the nursing notes.  Pertinent labs & imaging results that were available during my care of the patient were reviewed by me and considered in my medical decision making (see chart for details).     I explained to the patient this is likely a viral illness.  It may be exacerbated by her allergies.  Will treat her symptomatically.  Will give her an antibiotic to fill and take if she fails to improve over the next few days.  Follow-up with PCP Final Clinical Impressions(s) / UC Diagnoses   Final diagnoses:  Acute non-recurrent pansinusitis  Environmental and seasonal allergies     Discharge Instructions      Get plenty of rest, drink lots of fluids Take Mucinex 2 times a day.  This will help loosen the mucus so it can drain Take prednisone as directed.  This will help reduce the swelling, congestion, and pain If you fail to see improvement in the next couple of days fill and take the doxycycline.  It is important to take doxycycline with food Call or return if not better by next week, or see Dr. Molli Hazard   ED Prescriptions     Medication Sig Dispense Auth. Provider   predniSONE (DELTASONE) 20 MG tablet Take 1 tablet (20 mg total) by mouth 2 (two) times daily with a meal. 10 tablet Eustace Moore, MD   guaiFENesin (MUCINEX) 600 MG 12 hr tablet Take 1 tablet (600 mg total) by mouth 2 (two) times daily. 14 tablet Eustace Moore, MD   doxycycline  (VIBRAMYCIN) 100 MG capsule Take 1 capsule (100 mg total) by mouth 2 (two) times daily. 14 capsule Eustace Moore, MD      PDMP not reviewed this encounter.   Eustace Moore, MD 03/13/23 1021

## 2023-03-13 NOTE — Discharge Instructions (Signed)
Get plenty of rest, drink lots of fluids Take Mucinex 2 times a day.  This will help loosen the mucus so it can drain Take prednisone as directed.  This will help reduce the swelling, congestion, and pain If you fail to see improvement in the next couple of days fill and take the doxycycline.  It is important to take doxycycline with food Call or return if not better by next week, or see Dr. Molli Hazard

## 2023-03-16 LAB — CULTURE, GROUP A STREP (THRC)

## 2023-06-13 ENCOUNTER — Ambulatory Visit
Admission: RE | Admit: 2023-06-13 | Discharge: 2023-06-13 | Disposition: A | Discharge status: Home / Self Care | Payer: Managed Care, Other (non HMO) | Source: Ambulatory Visit | Attending: Obstetrics & Gynecology | Admitting: Obstetrics & Gynecology

## 2023-06-13 ENCOUNTER — Other Ambulatory Visit: Payer: Self-pay | Admitting: Obstetrics & Gynecology

## 2023-06-13 DIAGNOSIS — Z1231 Encounter for screening mammogram for malignant neoplasm of breast: Secondary | ICD-10-CM

## 2023-06-18 ENCOUNTER — Encounter: Payer: Self-pay | Admitting: Obstetrics & Gynecology

## 2023-11-21 ENCOUNTER — Telehealth: Admitting: Emergency Medicine

## 2023-11-21 ENCOUNTER — Other Ambulatory Visit: Payer: Self-pay | Admitting: Family Medicine

## 2023-11-21 DIAGNOSIS — J329 Chronic sinusitis, unspecified: Secondary | ICD-10-CM | POA: Diagnosis not present

## 2023-11-21 MED ORDER — AMOXICILLIN-POT CLAVULANATE 875-125 MG PO TABS
1.0000 | ORAL_TABLET | Freq: Two times a day (BID) | ORAL | 0 refills | Status: DC
Start: 2023-11-21 — End: 2023-12-03

## 2023-11-21 NOTE — Progress Notes (Signed)
 E-Visit for Sinus Problems  We are sorry that you are not feeling well.  Here is how we plan to help!  Based on what you have shared with me it looks like you have sinusitis.    This might still be viral, but we are going into a weekend and if you don't start to improve in the next day or so, we'd need to start you on antibiotics, so I'll send in for an antibiotic to start today.  Sinusitis is inflammation and infection in the sinus cavities of the head.  Based on your presentation I believe you most likely have Acute Bacterial Sinusitis.  This is an infection caused by bacteria and is treated with antibiotics. I have prescribed Augmentin 875mg /125mg  one tablet twice daily with food, for 7 days. You may use an oral decongestant such as Mucinex  D or if you have glaucoma or high blood pressure use plain Mucinex . Saline nasal spray help and can safely be used as often as needed for congestion.  If you develop worsening sinus pain, fever or notice severe headache and vision changes, or if symptoms are not better after completion of antibiotic, please schedule an appointment with a health care provider.    Sinus infections are not as easily transmitted as other respiratory infection, however we still recommend that you avoid close contact with loved ones, especially the very young and elderly.  Remember to wash your hands thoroughly throughout the day as this is the number one way to prevent the spread of infection!  Home Care: Only take medications as instructed by your medical team. Complete the entire course of an antibiotic. Do not take these medications with alcohol. A steam or ultrasonic humidifier can help congestion.  You can place a towel over your head and breathe in the steam from hot water coming from a faucet. Avoid close contacts especially the very young and the elderly. Cover your mouth when you cough or sneeze. Always remember to wash your hands.  Get Help Right Away If: You develop  worsening fever or sinus pain. You develop a severe head ache or visual changes. Your symptoms persist after you have completed your treatment plan.  Make sure you Understand these instructions. Will watch your condition. Will get help right away if you are not doing well or get worse.  Thank you for choosing an e-visit.  Your e-visit answers were reviewed by a board certified advanced clinical practitioner to complete your personal care plan. Depending upon the condition, your plan could have included both over the counter or prescription medications.  Please review your pharmacy choice. Make sure the pharmacy is open so you can pick up prescription now. If there is a problem, you may contact your provider through [employer] and have the prescription routed to another pharmacy.  Your safety is important to us . If you have drug allergies check your prescription carefully.   For the next 24 hours you can use MyChart to ask questions about today's visit, request a non-urgent call back, or ask for a work or school excuse. You will get an email in the next two days asking about your experience. I hope that your e-visit has been valuable and will speed your recovery.  Approximately 5 minutes was used in reviewing the patient's chart, questionnaire, prescribing medications, and documentation.

## 2023-11-26 ENCOUNTER — Telehealth: Payer: Self-pay

## 2023-11-26 ENCOUNTER — Other Ambulatory Visit (HOSPITAL_COMMUNITY): Payer: Self-pay

## 2023-11-26 NOTE — Telephone Encounter (Signed)
 Pharmacy Patient Advocate Encounter  Received notification from [employer] that Prior Authorization for  Nurtec 75MG  dispersible tablets has been APPROVED from 11/26/23 to 11/25/24. Unable to obtain price due to refill too soon rejection, last fill date 11/26/23 next available fill date8/13/25   PA #/Case ID/Reference #: [id number]

## 2023-11-26 NOTE — Telephone Encounter (Signed)
 Pharmacy Patient Advocate Encounter   Received notification from CoverMyMeds that prior authorization for Nurtec 75MG  dispersible tablets is required/requested.   Insurance verification completed.   The patient is insured through [employer] .   Per test claim: PA required; PA submitted to above mentioned insurance via CoverMyMeds Key/confirmation #/EOC [id number] Status is pending

## 2023-11-28 ENCOUNTER — Ambulatory Visit: Admitting: Podiatry

## 2023-11-28 ENCOUNTER — Encounter: Payer: Self-pay | Admitting: Podiatry

## 2023-11-28 ENCOUNTER — Ambulatory Visit (INDEPENDENT_AMBULATORY_CARE_PROVIDER_SITE_OTHER)

## 2023-11-28 ENCOUNTER — Telehealth: Payer: Self-pay | Admitting: *Deleted

## 2023-11-28 DIAGNOSIS — M722 Plantar fascial fibromatosis: Secondary | ICD-10-CM | POA: Diagnosis not present

## 2023-11-28 MED ORDER — TRIAMCINOLONE ACETONIDE 10 MG/ML IJ SUSP
2.5000 mg | Freq: Once | INTRAMUSCULAR | Status: AC
  Administered 2023-11-28: 2.5 mg via INTRA_ARTICULAR

## 2023-11-28 MED ORDER — DEXAMETHASONE SODIUM PHOSPHATE 120 MG/30ML IJ SOLN
4.0000 mg | Freq: Once | INTRAMUSCULAR | Status: AC
  Administered 2023-11-28: 4 mg via INTRA_ARTICULAR

## 2023-11-28 NOTE — Progress Notes (Signed)
 dex  Subjective:  Patient ID: Erin Little, female    DOB: 06/03/1980,   MRN: [id number]  Chief Complaint  Patient presents with   Foot Pain    I have Plantar Fascia on both but it's more severe on the left.  I think I have a bunion on both feet and maybe some Gout.  I think I have a heel spur on my left foot.    43 y.o. female presents for concern of bilateral plantar fasciitis and bunions. Relates she has been dealing with plantar fasciitis for many years. She has been following with Dr T and has had injections. She was going to try inserts but unable at the time. She has had anti-inflammatories but had reactions and just taking ibuprofen now. She has tried some stretching at home.  Denies any other pedal complaints. Denies n/v/f/c.   Past Medical History:  Diagnosis Date   Migraines     Objective:  Physical Exam: Vascular: DP/PT pulses 2/4 bilateral. CFT <3 seconds. Normal hair growth on digits. No edema.  Skin. No lacerations or abrasions bilateral feet.  Musculoskeletal: MMT 5/5 bilateral lower extremities in DF, PF, Inversion and Eversion. Deceased ROM in DF of ankle joint. Tender to the medial calcaneal tubercle left . No pain with achilles, PT or arch. No pain with calcaneal squeeze. HAV deformity noted bilateral with minimal tenderness to medial eminence and with ROM of the first MPJ Bilateral.  Neurological: Sensation intact to light touch.   Assessment:   1. Plantar fasciitis, left      Plan:  Patient was evaluated and treated and all questions answered. Discussed plantar fasciitis and HAV deformity with patient.  X-rays reviewed and discussed with patient. No acute fractures or dislocations noted. Mild spurring noted at inferior calcaneus. Bilateral HAV deformity with IM 1-2 angle on right of 13 degrees and IM 1-2 angle on left of 14 degrees.  Reviewed available notes from previous provider.  Discussed treatment options including, ice, NSAIDS, supportive shoes,  bracing, and stretching. Stretching exercises provided to be done on a daily basis.  . Patient requesting injection today. Procedure note below.   PF brace dispensed.  Follow-up 6 weeks or sooner if any problems arise. In the meantime, encouraged to call the office with any questions, concerns, change in symptoms.   Procedure:  Discussed etiology, pathology, conservative vs. surgical therapies. At this time a plantar fascial injection was recommended.  The patient agreed and a sterile skin prep was applied.  An injection consisting of  1cc dexamethasone 0.5 cc kenalog  and 1cc marcaine mixture was infiltrated at the point of maximal tenderness on the left Heel.  Bandaid applied. The patient tolerated this well and was given instructions for aftercare.    Asberry Failing, DPM

## 2023-11-28 NOTE — Patient Instructions (Signed)

## 2023-11-28 NOTE — Telephone Encounter (Signed)
 I called the patient and asked her to come in about 15 to 20 minutes early because we need x-rays of her foot.  She asked that both feet be x-rayed.

## 2023-12-03 ENCOUNTER — Ambulatory Visit
Admission: RE | Admit: 2023-12-03 | Discharge: 2023-12-03 | Disposition: A | Discharge status: Home / Self Care | Attending: Family Medicine | Admitting: Family Medicine

## 2023-12-03 ENCOUNTER — Ambulatory Visit
Admission: RE | Admit: 2023-12-03 | Discharge: 2023-12-03 | Disposition: A | Discharge status: Home / Self Care | Source: Ambulatory Visit | Attending: Family Medicine | Admitting: Family Medicine

## 2023-12-03 ENCOUNTER — Telehealth: Payer: Self-pay | Admitting: Emergency Medicine

## 2023-12-03 VITALS — BP 142/85 | HR 83 | Temp 98.4°F | Resp 18 | Ht 64.0 in | Wt 230.0 lb

## 2023-12-03 DIAGNOSIS — S93492A Sprain of other ligament of left ankle, initial encounter: Secondary | ICD-10-CM

## 2023-12-03 NOTE — ED Notes (Signed)
 After placing left ankle xray order, patient states that she does not want any xray here, she would like to get it at her place of employment.  Dr Maranda notified.

## 2023-12-03 NOTE — ED Provider Notes (Signed)
 [name]    CSN: [id number] Arrival date & time: 12/03/23  0830      History   Chief Complaint Chief Complaint  Patient presents with   Ankle Pain    Rolled Ankle - Entered by patient    HPI Erin Little is a 43 y.o. female.   Patient is here for ankle pain.  She states that she rolled her ankle a couple days ago.  She did not fall but it was painful.  She has been limping around on it since then.  She has noticed that it is very swollen and has deep purple discoloration around the outer ankle.  She states she has trouble with his ankle sprain easily. We discussed getting an x-ray.  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  I will call her for the result is positive She is under the care of podiatry for plantar fasciitis.  She has physical therapy scheduled.    Past Medical History:  Diagnosis Date   Migraines     Patient Active Problem List   Diagnosis Date Noted   Lateral epicondylitis, right elbow 11/22/2021   Depression, major, single episode, mild (HCC) 10/01/2020   Plantar fasciitis, right 09/24/2019   Generalized anxiety disorder 06/28/2015   Insomnia 06/28/2015   Recurrent herpes labialis 06/28/2015   Atypical squamous cell changes of undetermined significance (ASCUS) on vaginal cytology 06/28/2015   Calculus of left kidney 06/28/2015   Episodic tension-type headache, not intractable 06/28/2015   Irregular menses 06/28/2015   Obesity 06/28/2015   Recurrent UTI 06/28/2015   ADD (attention deficit disorder) 10/13/2013   Chronic migraine without aura 10/13/2013   Interstitial cystitis (chronic) without hematuria 10/13/2013   Increased frequency of urination 10/13/2013   Lower abdominal pain 10/13/2013   Midline low back pain without sciatica 10/13/2013   Urge incontinence of urine 10/13/2013   Urinary hesitancy 10/13/2013   Foot pain, bilateral 06/30/2012   Accumulation of fluid in tissues 06/30/2012   Foot  pain 06/30/2012   Migraine, unspecified, not intractable, without status migrainosus 07/01/2011    Past Surgical History:  Procedure Laterality Date   APPENDECTOMY     ELBOW ARTHROSCOPY     TONSILLECTOMY      OB History     Gravida  2   Para  2   Term  2   Preterm      AB      Living  2      SAB      IAB      Ectopic      Multiple      Live Births               Home Medications    Prior to Admission medications   Medication Sig Start Date End Date Taking? Authorizing Provider  cetirizine (ZYRTEC) 10 MG tablet Take 10 mg by mouth daily. 01/29/23  Yes [provider]  EPINEPHrine 0.3 mg/0.3 mL IJ SOAJ injection Inject 0.3 mg into the muscle as needed. 01/29/23  Yes [provider]  Rimegepant Sulfate  (NURTEC) 75 MG TBDP Take 1 tablet (75 mg total) by mouth every other day. Take for migraine prevention 12/03/22  Yes Alvia Bring, DO  rizatriptan  (MAXALT -MLT) 10 MG disintegrating tablet Take 1 tablet (10 mg total) by mouth as needed for migraine. May repeat in 2 hours if needed 08/09/22  Yes Alvia, Bring, DO  valACYclovir  (VALTREX ) 500 MG tablet TAKE 1 TABLET (500  MG TOTAL) BY MOUTH DAILY. TAKE FOR 5 DAYS AS NEEDED FOR COLD SORE 11/21/23  Yes Alvia Bring, DO  famotidine (PEPCID) 20 MG tablet Take 20 mg by mouth 2 (two) times daily. 01/29/23   [provider]    Family History Family History  Problem Relation Age of Onset   Parkinson's disease Mother    Alzheimer's disease Father    Breast cancer Neg Hx     Social History Social History   Tobacco Use   Smoking status: Never   Smokeless tobacco: Never  Vaping Use   Vaping status: Never Used  Substance Use Topics   Alcohol use: Not Currently   Drug use: Not Currently     Allergies   Latex, Sulfamethoxazole-trimethoprim, Cephalexin, and Nitrofurantoin   Review of Systems Review of Systems See  Physical Exam Triage Vital Signs ED Triage Vitals  Encounter Vitals  Group     BP 12/03/23 0844 (!) 142/85     Girls Systolic BP Percentile --      Girls Diastolic BP Percentile --      Boys Systolic BP Percentile --      Boys Diastolic BP Percentile --      Pulse Rate 12/03/23 0844 83     Resp 12/03/23 0844 18     Temp 12/03/23 0844 98.4 F (36.9 C)     Temp Source 12/03/23 0844 Oral     SpO2 12/03/23 0844 95 %     Weight 12/03/23 0843 230 lb (104.3 kg)     Height 12/03/23 0843 5' 4 (1.626 m)     Head Circumference --      Peak Flow --      Pain Score 12/03/23 0842 6     Pain Loc --      Pain Education --      Exclude from Growth Chart --    No data found.  Updated Vital Signs BP (!) 142/85 (BP Location: Left Arm)   Pulse 83   Temp 98.4 F (36.9 C) (Oral)   Resp 18   Ht 5' 4 (1.626 m)   Wt 104.3 kg   LMP 11/07/2023 (Approximate)   SpO2 95%   BMI 39.48 kg/m       Physical Exam Constitutional:      General: She is not in acute distress.    Appearance: She is well-developed.  HENT:     Head: Normocephalic and atraumatic.  Eyes:     Conjunctiva/sclera: Conjunctivae normal.     Pupils: Pupils are equal, round, and reactive to light.  Cardiovascular:     Rate and Rhythm: Normal rate.  Pulmonary:     Effort: Pulmonary effort is normal. No respiratory distress.  Musculoskeletal:        General: Swelling, tenderness and signs of injury present. Normal range of motion.     Cervical back: Normal range of motion.     Comments: Right ankle shows purple ecchymosis around the lateral foot.  There is tenderness over the fifth metatarsal as well as the distal fibula which is mild.  There is tenderness over the ATFL.  Full range of motion.  No instability.  Skin:    General: Skin is warm and dry.     Findings: Bruising present.  Neurological:     Mental Status: She is alert.      UC Treatments / Results  Labs (all labs ordered are listed, but only abnormal results are displayed) Labs Reviewed - No data to  display  EKG   Radiology No results found.  Procedures Procedures (including critical care time)  Medications Ordered in UC Medications - No data to display  Initial Impression / Assessment and Plan / UC Course  I have reviewed the triage vital signs and the nursing notes.  Pertinent labs & imaging results that were available during my care of the patient were reviewed by me and considered in my medical decision making (see chart for details).     Discussed likely ankle sprain.  Patient is going to get an x-ray at work.  Will put her in an Aircast splint.  I ordered physical therapy.  She should follow-up with sports medicine if she fails to improve Final Clinical Impressions(s) / UC Diagnoses   Final diagnoses:  Sprain of anterior talofibular ligament of left ankle, initial encounter     Discharge Instructions      Limit walking while ankle is painful Use ice and elevation to reduce pain and swelling May take ibuprofen 800 mg 3 times a day with food I have ordered physical therapy for your ankle I recommend sports medicine consultation if you fail to improve    ED Prescriptions   None    PDMP not reviewed this encounter.   Maranda Jamee Jacob, MD 12/03/23 [phone]

## 2023-12-03 NOTE — Telephone Encounter (Signed)
 Patient informed of her negative left ankle xray per Dr Maranda.  Patient voices understanding.

## 2023-12-03 NOTE — Discharge Instructions (Addendum)
 Limit walking while ankle is painful Use ice and elevation to reduce pain and swelling May take ibuprofen 800 mg 3 times a day with food I have ordered physical therapy for your ankle I recommend sports medicine consultation if you fail to improve

## 2023-12-03 NOTE — ED Triage Notes (Signed)
 Patient states that she rolled her left ankle on Monday.  Patient is currently wearing a brace on her foot for plantar fasciitis.  Patient does have some bruising and some difficulty w/walking.  Patient has taken Tylenol  and Ibuprofen for pain.

## 2023-12-04 ENCOUNTER — Ambulatory Visit: Payer: Self-pay

## 2023-12-04 ENCOUNTER — Encounter: Admitting: Family Medicine

## 2023-12-05 NOTE — Therapy (Incomplete)
 OUTPATIENT PHYSICAL THERAPY LOWER EXTREMITY EVALUATION   Patient Name: Erin Little MRN: [id number] DOB:February 18, 1981, 43 y.o., female Today's Date: 12/05/2023  END OF SESSION:   Past Medical History:  Diagnosis Date   Migraines    Past Surgical History:  Procedure Laterality Date   APPENDECTOMY     ELBOW ARTHROSCOPY     TONSILLECTOMY     Patient Active Problem List   Diagnosis Date Noted   Lateral epicondylitis, right elbow 11/22/2021   Depression, major, single episode, mild (HCC) 10/01/2020   Plantar fasciitis, right 09/24/2019   Generalized anxiety disorder 06/28/2015   Insomnia 06/28/2015   Recurrent herpes labialis 06/28/2015   Atypical squamous cell changes of undetermined significance (ASCUS) on vaginal cytology 06/28/2015   Calculus of left kidney 06/28/2015   Episodic tension-type headache, not intractable 06/28/2015   Irregular menses 06/28/2015   Obesity 06/28/2015   Recurrent UTI 06/28/2015   ADD (attention deficit disorder) 10/13/2013   Chronic migraine without aura 10/13/2013   Interstitial cystitis (chronic) without hematuria 10/13/2013   Increased frequency of urination 10/13/2013   Lower abdominal pain 10/13/2013   Midline low back pain without sciatica 10/13/2013   Urge incontinence of urine 10/13/2013   Urinary hesitancy 10/13/2013   Foot pain, bilateral 06/30/2012   Accumulation of fluid in tissues 06/30/2012   Foot pain 06/30/2012   Migraine, unspecified, not intractable, without status migrainosus 07/01/2011    PCP: Alvia Bring, DO  REFERRING PROVIDER: Joya Stabs, DPM  REFERRING DIAG: M72.2 (ICD-10-CM) - Plantar fasciitis, left  THERAPY DIAG:  No diagnosis found.  Rationale for Evaluation and Treatment: Rehabilitation  ONSET DATE: ***  SUBJECTIVE:   SUBJECTIVE STATEMENT: ***  PERTINENT HISTORY: migraines, ADD, GAD, insomnia  PAIN:  Are you having pain: *** Location/description: *** Best-worst over past week: ***  -  aggravating factors: *** - Easing factors: ***    PRECAUTIONS: None  RED FLAGS: {PT Red Flags:29287}   WEIGHT BEARING RESTRICTIONS: No  FALLS:  Has patient fallen in last 6 months? {fallsyesno:27318}  LIVING ENVIRONMENT: Lives with: {[facility] lives with:25569::lives with their family} Lives in: {Lives in:25570} Stairs: {opstairs:27293} Has following equipment at home: {Assistive devices:23999}  OCCUPATION: ***  PLOF: {PLOF:24004}  PATIENT GOALS: ***  NEXT MD VISIT: ***  OBJECTIVE:  Note: Objective measures were completed at Evaluation unless otherwise noted.  DIAGNOSTIC FINDINGS:  12/03/23 L anke XR: IMPRESSION: Mild soft tissue swelling overlying the lateral malleolus without underlying fracture or dislocation. If the patient has continued symptoms, repeat radiographs in 10-14 days.  PATIENT SURVEYS:  LEFS: ***  COGNITION: Overall cognitive status: Within functional limits for tasks assessed     SENSATION: {sensation:27233}  EDEMA:  {edema:24020}  MUSCLE LENGTH: Hamstrings: Right *** deg; Left *** deg Debby test: Right *** deg; Left *** deg Gastroc/soleus: L vs R ***   POSTURE: {posture:25561}  PALPATION: ***  LOWER EXTREMITY ROM:  ROM Right eval Left eval  Knee flexion    Knee extension    Ankle dorsiflexion    Ankle plantarflexion    Ankle inversion    Ankle eversion     (Blank rows = not tested) (Key: WFL = within functional limits not formally assessed, * = concordant pain, s = stiffness/stretching sensation, NT = not tested) Comments:   LOWER EXTREMITY MMT:  MMT Right eval Left eval  Knee flexion    Knee extension    Ankle dorsiflexion    Ankle plantarflexion    Ankle inversion    Ankle eversion     (  Blank rows = not tested) (Key: WFL = within functional limits not formally assessed, * = concordant pain, s = stiffness/stretching sensation, NT = not tested) Comment:   LOWER EXTREMITY SPECIAL TESTS:   {LEspecialtests:26242}  FUNCTIONAL TESTS:  30secSTS/5xSTS: *** TUG: *** Single/double heel raise: ***   GAIT: Distance walked: within clinic Assistive device utilized: {Assistive devices:23999} Level of assistance: {Levels of assistance:24026} Comments: ***                                                                                                                                 TREATMENT DATE:  [facility] Adult PT Treatment:                                                DATE: 12/08/23 Therapeutic Exercise: *** Manual Therapy: *** Neuromuscular re-ed: *** Therapeutic Activity: *** Modalities: *** Self Care: ***     PATIENT EDUCATION:  Education details: Pt education on PT impairments, prognosis, and POC. Informed consent. Rationale for interventions, safe/appropriate HEP performance Person educated: Patient Education method: Explanation, Demonstration, Tactile cues, Verbal cues Education comprehension: verbalized understanding, returned demonstration, verbal cues required, tactile cues required, and needs further education    HOME EXERCISE PROGRAM: ***  ASSESSMENT:  CLINICAL IMPRESSION: Patient is a 43 y.o. woman who was seen today for physical therapy evaluation and treatment for plantar fascia issues and recurrent ankle sprains. ***    OBJECTIVE IMPAIRMENTS: {opptimpairments:25111}.   ACTIVITY LIMITATIONS: {activitylimitations:27494}  PARTICIPATION LIMITATIONS: {participationrestrictions:25113}  PERSONAL FACTORS: Time since onset of injury/illness/exacerbation and 3+ comorbidities: migraines, ADD, GAD, insomnia are also affecting patient's functional outcome.   REHAB POTENTIAL: {rehabpotential:25112}  CLINICAL DECISION MAKING: {clinical decision making:25114}  EVALUATION COMPLEXITY: {Evaluation complexity:25115}   GOALS: Goals reviewed with patient? {yes/no:20286}  SHORT TERM GOALS: Target date: ***  Pt will demonstrate appropriate understanding and  performance of initially prescribed HEP in order to facilitate improved independence with management of symptoms.  Baseline: HEP ***  Goal status: INITIAL   2. Pt will report at least 25% improvement in overall pain levels over past week in order to facilitate improved tolerance to typical daily activities.   Baseline: ***  Goal status: INITIAL    LONG TERM GOALS: Target date: ***  Pt will score *** or greater on LEFS in order to demonstrate improved perception of function due to symptoms (MCID 9 pts) Baseline: *** Goal status: INITIAL  2.  Pt will demonstrate at least *** degrees of *** AROM in order to facilitate improved tolerance to functional movements such as ***.  Baseline: see ROM chart above Goal status: INITIAL  3.  Pt will be able to lift up to *** in order to demonstrate improved capacity for daily activities such as ***.  Baseline: *** Goal status: INITIAL  4.  Pt will be able  to perform 5xSTS in less than or equal to *** in order to demonstrate reduced fall risk and improved functional independence (MCID 5xSTS = 2.3 sec). Baseline: *** Goal status: INITIAL    PLAN:  PT FREQUENCY: {rehab frequency:25116}  PT DURATION: {rehab duration:25117}  PLANNED INTERVENTIONS: 97164- PT Re-evaluation, 97750- Physical Performance Testing, 97110-Therapeutic exercises, 97530- Therapeutic activity, [id number]- Neuromuscular re-education, 97535- Self Care, 02859- Manual therapy, [id number]- Gait training, [phone]- Aquatic Therapy, [phone]- Electrical stimulation (unattended), [phone] (1-2 muscles), 20561 (3+ muscles)- Dry Needling, Patient/Family education, Balance training, Stair training, Taping, Joint mobilization, Joint manipulation, Cryotherapy, and Moist heat  PLAN FOR NEXT SESSION: Review/update HEP PRN. Work on [employer] exercises as appropriate with emphasis on ***. Symptom modification strategies as indicated/appropriate.    Alm DELENA Jenny PT, DPT 12/05/2023 10:28 AM

## 2023-12-08 ENCOUNTER — Ambulatory Visit: Attending: Podiatry | Admitting: Physical Therapy

## 2023-12-30 ENCOUNTER — Encounter: Payer: Self-pay | Admitting: Sports Medicine

## 2023-12-30 ENCOUNTER — Encounter: Admitting: Family Medicine

## 2024-01-04 ENCOUNTER — Inpatient Hospital Stay
Admission: RE | Admit: 2024-01-04 | Discharge: 2024-01-04 | Disposition: A | Discharge status: Home / Self Care | Source: Ambulatory Visit | Attending: Family Medicine | Admitting: Family Medicine

## 2024-01-05 ENCOUNTER — Ambulatory Visit

## 2024-01-05 ENCOUNTER — Other Ambulatory Visit: Payer: Self-pay

## 2024-01-05 ENCOUNTER — Inpatient Hospital Stay
Admission: EM | Admit: 2024-01-05 | Discharge: 2024-01-05 | Discharge status: Home / Self Care | Attending: Family Medicine

## 2024-01-05 VITALS — BP 153/92 | HR 87 | Temp 97.7°F | Resp 16

## 2024-01-05 DIAGNOSIS — M79671 Pain in right foot: Secondary | ICD-10-CM

## 2024-01-05 MED ORDER — METHYLPREDNISOLONE 4 MG PO TBPK: ORAL_TABLET | ORAL | 0 refills | Status: DC

## 2024-01-05 NOTE — Discharge Instructions (Signed)
 Take the Medrol  Dosepak as directed.  Take all of day 1 today Limit walking while foot is painful Ice and stretching may help Follow-up with podiatry

## 2024-01-05 NOTE — ED Provider Notes (Signed)
 [facility]    CSN: [id number] Arrival date & time: 01/05/24  0827      History   Chief Complaint Chief Complaint  Patient presents with   Foot Pain    HPI Erin Little is a 43 y.o. female.   HPI  Patient has plantar fasciitis.  Has had multiple injections.  Treatment and therapy.  Is under the care of podiatry.  States it is worse on her left foot.  She states that she is having right foot pain for the last couple of days that has been quite severe at times.  Hurts even at rest.  She thinks that she has overworked the right leg by trying to protect the left leg.  No accident or injury.  No change in shoes not improved with over-the-counter Tylenol  and ibuprofen.  Past Medical History:  Diagnosis Date   Migraines     Patient Active Problem List   Diagnosis Date Noted   Lateral epicondylitis, right elbow 11/22/2021   Depression, major, single episode, mild (HCC) 10/01/2020   Plantar fasciitis, right 09/24/2019   Generalized anxiety disorder 06/28/2015   Insomnia 06/28/2015   Recurrent herpes labialis 06/28/2015   Atypical squamous cell changes of undetermined significance (ASCUS) on vaginal cytology 06/28/2015   Calculus of left kidney 06/28/2015   Episodic tension-type headache, not intractable 06/28/2015   Irregular menses 06/28/2015   Obesity 06/28/2015   Recurrent UTI 06/28/2015   ADD (attention deficit disorder) 10/13/2013   Chronic migraine without aura 10/13/2013   Interstitial cystitis (chronic) without hematuria 10/13/2013   Increased frequency of urination 10/13/2013   Lower abdominal pain 10/13/2013   Midline low back pain without sciatica 10/13/2013   Urge incontinence of urine 10/13/2013   Urinary hesitancy 10/13/2013   Foot pain, bilateral 06/30/2012   Accumulation of fluid in tissues 06/30/2012   Foot pain 06/30/2012   Migraine, unspecified, not intractable, without status migrainosus 07/01/2011    Past Surgical History:  Procedure  Laterality Date   APPENDECTOMY     ELBOW ARTHROSCOPY     TONSILLECTOMY      OB History     Gravida  2   Para  2   Term  2   Preterm      AB      Living  2      SAB      IAB      Ectopic      Multiple      Live Births               Home Medications    Prior to Admission medications   Medication Sig Start Date End Date Taking? Authorizing Provider  methylPREDNISolone  (MEDROL  DOSEPAK) 4 MG TBPK tablet tad 01/05/24  Yes Maranda Jamee Jacob, MD  cetirizine (ZYRTEC) 10 MG tablet Take 10 mg by mouth daily. 01/29/23   [provider]  EPINEPHrine 0.3 mg/0.3 mL IJ SOAJ injection Inject 0.3 mg into the muscle as needed. 01/29/23   [provider]  famotidine (PEPCID) 20 MG tablet Take 20 mg by mouth 2 (two) times daily. 01/29/23   [provider]  Rimegepant Sulfate  (NURTEC) 75 MG TBDP Take 1 tablet (75 mg total) by mouth every other day. Take for migraine prevention 12/03/22   Alvia Bring, DO  rizatriptan  (MAXALT -MLT) 10 MG disintegrating tablet Take 1 tablet (10 mg total) by mouth as needed for migraine. May repeat in 2 hours if needed 08/09/22   Alvia Bring, DO  valACYclovir  (  VALTREX ) 500 MG tablet TAKE 1 TABLET (500 MG TOTAL) BY MOUTH DAILY. TAKE FOR 5 DAYS AS NEEDED FOR COLD SORE 11/21/23   Alvia Bring, DO    Family History Family History  Problem Relation Age of Onset   Parkinson's disease Mother    Alzheimer's disease Father    Breast cancer Neg Hx     Social History Social History   Tobacco Use   Smoking status: Never   Smokeless tobacco: Never  Vaping Use   Vaping status: Never Used  Substance Use Topics   Alcohol use: Not Currently   Drug use: Not Currently     Allergies   Latex, Sulfamethoxazole-trimethoprim, Cephalexin, and Nitrofurantoin   Review of Systems Review of Systems See HPI  Physical Exam Triage Vital Signs ED Triage Vitals  Encounter Vitals Group     BP 01/05/24 0832 (!) 153/92     Girls  Systolic BP Percentile --      Girls Diastolic BP Percentile --      Boys Systolic BP Percentile --      Boys Diastolic BP Percentile --      Pulse Rate 01/05/24 0832 87     Resp 01/05/24 0832 16     Temp 01/05/24 0832 97.7 F (36.5 C)     Temp src --      SpO2 01/05/24 0832 97 %     Weight --      Height --      Head Circumference --      Peak Flow --      Pain Score 01/05/24 0837 7     Pain Loc --      Pain Education --      Exclude from Growth Chart --    No data found.  Updated Vital Signs BP (!) 153/92   Pulse 87   Temp 97.7 F (36.5 C)   Resp 16   LMP 12/08/2023 (Approximate)   SpO2 97%      Physical Exam Constitutional:      General: She is not in acute distress.    Appearance: She is well-developed.  HENT:     Head: Normocephalic and atraumatic.  Eyes:     Conjunctiva/sclera: Conjunctivae normal.     Pupils: Pupils are equal, round, and reactive to light.  Cardiovascular:     Rate and Rhythm: Normal rate.  Pulmonary:     Effort: Pulmonary effort is normal. No respiratory distress.  Abdominal:     General: There is no distension.     Palpations: Abdomen is soft.  Musculoskeletal:        General: Tenderness present. Normal range of motion.     Cervical back: Normal range of motion.     Comments: Patient has hallux valgus right foot.  Otherwise normal appearance.  No erythema or swelling.  Tenderness about the heel, and at the plantar fascial insertion.  Skin:    General: Skin is warm and dry.  Neurological:     Mental Status: She is alert.     Gait: Gait abnormal.      UC Treatments / Results  Labs (all labs ordered are listed, but only abnormal results are displayed) Labs Reviewed - No data to display  EKG   Radiology DG Foot Complete Right Result Date: 01/05/2024 EXAM: 3 or more VIEW(S) XRAY OF THE RIGHT FOOT 01/05/2024 09:09:49 AM COMPARISON: 11/28/2023 CLINICAL HISTORY: Heel pain. Pt c/o rt heel pain x 4-5dys, nki. FINDINGS: BONES AND  JOINTS: No  acute fracture. Hallux valgus deformity with degenerative changes at the first MTP (metatarsophalangeal) joint. Plantar heel spur. SOFT TISSUES: The soft tissues are unremarkable. IMPRESSION: 1. Plantar heel spur. 2. Hallux valgus deformity with degenerative changes at the first MTP joint. Electronically signed by: Waddell Calk MD 01/05/2024 09:17 AM EDT RP Workstation: [id number]    Procedures Procedures (including critical care time)  Medications Ordered in UC Medications - No data to display  Initial Impression / Assessment and Plan / UC Course  I have reviewed the triage vital signs and the nursing notes.  Pertinent labs & imaging results that were available during my care of the patient were reviewed by me and considered in my medical decision making (see chart for details).     Likely has plantar fasciitis although she states that the pain is severe even at rest.  Will treat with steroids, ice, rest, follow-up with her podiatrist Final Clinical Impressions(s) / UC Diagnoses   Final diagnoses:  Pain of right heel     Discharge Instructions      Take the Medrol  Dosepak as directed.  Take all of day 1 today Limit walking while foot is painful Ice and stretching may help Follow-up with podiatry    ED Prescriptions     Medication Sig Dispense Auth. Provider   methylPREDNISolone  (MEDROL  DOSEPAK) 4 MG TBPK tablet tad 21 tablet Maranda Jamee Jacob, MD      PDMP not reviewed this encounter.   Maranda Jamee Jacob, MD 01/05/24 1027

## 2024-01-05 NOTE — ED Triage Notes (Addendum)
 Has plantar fasciitis to both feet, worse in left. Was at a ball game Saturday morning and started feeling pain in right heel. States maybe has been compensating from walking with plantar fasciitis to left foot. Pain in right foot is pulsating waves of pain. Sts the pain has shooting pains up into leg as well. Reports the pain is worse when wearing a shoe. Has been using tylenol , ibuprofen since Saturday. Did not take otc meds last night. Took an old tizanidine  Saturday night, which relieved some of her pain. Elevation helps to relieve pain.

## 2024-01-29 ENCOUNTER — Ambulatory Visit: Admitting: Podiatry

## 2024-01-29 DIAGNOSIS — Z91199 Patient's noncompliance with other medical treatment and regimen due to unspecified reason: Secondary | ICD-10-CM

## 2024-01-29 NOTE — Progress Notes (Signed)
 No show

## 2024-02-04 ENCOUNTER — Telehealth: Admitting: Physician Assistant

## 2024-02-04 DIAGNOSIS — R3989 Other symptoms and signs involving the genitourinary system: Secondary | ICD-10-CM | POA: Diagnosis not present

## 2024-02-04 MED ORDER — CIPROFLOXACIN HCL 500 MG PO TABS: 500.0000 mg | ORAL_TABLET | Freq: Two times a day (BID) | ORAL | 0 refills | Status: AC

## 2024-02-04 MED ORDER — PHENAZOPYRIDINE HCL 100 MG PO TABS: 100.0000 mg | ORAL_TABLET | Freq: Three times a day (TID) | ORAL | 0 refills | Status: AC | PRN

## 2024-02-04 NOTE — Addendum Note (Signed)
 Addended by: VIVIENNE DELON HERO on: 02/04/2024 09:26 AM   Modules accepted: Orders

## 2024-02-04 NOTE — Progress Notes (Signed)

## 2024-04-16 ENCOUNTER — Telehealth: Admitting: Physician Assistant

## 2024-04-16 DIAGNOSIS — J111 Influenza due to unidentified influenza virus with other respiratory manifestations: Secondary | ICD-10-CM

## 2024-04-16 MED ORDER — OSELTAMIVIR PHOSPHATE 75 MG PO CAPS: 75.0000 mg | ORAL_CAPSULE | Freq: Two times a day (BID) | ORAL | 0 refills | Status: AC

## 2024-04-16 MED ORDER — PROMETHAZINE-DM 6.25-15 MG/5ML PO SYRP: 5.0000 mL | ORAL_SOLUTION | Freq: Four times a day (QID) | ORAL | 0 refills | Status: AC | PRN

## 2024-04-16 NOTE — Progress Notes (Signed)
 E visit for Flu like symptoms   We are sorry that you are not feeling well.  Here is how we plan to help! Based on what you have shared with me it looks like you may have a respiratory virus that may be influenza.  Influenza or the flu is  an infection caused by a respiratory virus. The flu virus is highly contagious and persons who did not receive their yearly flu vaccination may catch the flu from close contact.  We have anti-viral medications to treat the viruses that cause this infection. They are not a cure and only shorten the course of the infection. These prescriptions are most effective when they are given within the first 2 days of flu symptoms. Antiviral medications are indicated if you have a high risk of complications from the flu. You should  also consider an antiviral medication if you are in close contact with someone who is at risk. These medications can help patients avoid complications from the flu but have side effects that you should know.   Possible side effects from Tamiflu  or oseltamivir  include nausea, vomiting, diarrhea, dizziness, headaches, eye redness, sleep problems or other respiratory symptoms. You should not take Tamiflu  if you have an allergy to oseltamivir  or any to the ingredients in Tamiflu .  Based upon your symptoms and potential risk factors I have prescribed Oseltamivir  (Tamiflu ).  It has been sent to your designated pharmacy.  You will take one 75 mg capsule orally twice a day for the next 5 days.   For nasal congestion, you may use an oral decongestant such as Mucinex  D or if you have glaucoma or high blood pressure use plain Mucinex .  Saline nasal spray or nasal drops can help and can safely be used as often as needed for congestion.  If you have a sore or scratchy throat, use a saltwater gargle-  to  teaspoon of salt dissolved in a 4-ounce to 8-ounce glass of warm water.  Gargle the solution for approximately 15-30 seconds and then spit.  It is  important not to swallow the solution.  You can also use throat lozenges/cough drops and Chloraseptic spray to help with throat pain or discomfort.  Warm or cold liquids can also be helpful in relieving throat pain.  For headache, pain or general discomfort, you can use Ibuprofen  or Tylenol  as directed.   Some authorities believe that zinc  sprays or the use of Echinacea may shorten the course of your symptoms.  I have prescribed the following medications to help lessen symptoms: I have prescribed Phenergan  DM 6.25 mg/15 mg. You make take one teaspoon / 5 ml every 4-6 hours as needed for cough  You are to isolate at home until you have been fever-free for at least 24 hours without a fever-reducing medication, and symptoms have been steadily improving for 24 hours.  If you must be around other household members who do not have symptoms, you need to make sure that both you and the family members are masking consistently with a high-quality mask.  If you note any worsening of symptoms despite treatment, please seek an in-person evaluation ASAP. If you note any significant shortness of breath or any chest pain, please seek ED evaluation. Please do not delay care!  ANYONE WHO HAS FLU SYMPTOMS SHOULD: Stay home. The flu is highly contagious and going out or to work exposes others! Be sure to drink plenty of fluids. Water is fine as well as fruit juices, sodas and electrolyte beverages. You  may want to stay away from caffeine or alcohol. If you are nauseated, try taking small sips of liquids. How do you know if you are getting enough fluid? Your urine should be a pale yellow or almost colorless. Get rest. Taking a steamy shower or using a humidifier may help nasal congestion and ease sore throat pain. Using a saline nasal spray works much the same way. Cough drops, hard candies and sore throat lozenges may ease your cough. Line up a caregiver. Have someone check on you regularly.  GET HELP RIGHT AWAY  IF: You cannot keep down liquids or your medications. You become short of breath Your fell like you are going to pass out or loose consciousness. Your symptoms persist after you have completed your treatment plan  MAKE SURE YOU  Understand these instructions. Will watch your condition. Will get help right away if you are not doing well or get worse.  Your e-visit answers were reviewed by a board certified advanced clinical practitioner to complete your personal care plan.  Depending on the condition, your plan could have included both over the counter or prescription medications.  If there is a problem please reply  once you have received a response from your provider.  Your safety is important to us .  If you have drug allergies check your prescription carefully.    You can use MyChart to ask questions about todays visit, request a non-urgent call back, or ask for a work or school excuse for 24 hours related to this e-Visit. If it has been greater than 24 hours you will need to follow up with your provider, or enter a new e-Visit to address those concerns.  You will get an e-mail in the next two days asking about your experience.  I hope that your e-visit has been valuable and will speed your recovery. Thank you for using e-visits.   I have spent 5 minutes in review of e-visit questionnaire, review and updating patient chart, medical decision making and response to patient.   Sakeenah Valcarcel, FNP

## 2024-04-17 ENCOUNTER — Encounter

## 2024-04-17 MED ORDER — LIDOCAINE VISCOUS HCL 2 % MT SOLN: 15.0000 mL | OROMUCOSAL | 0 refills | Status: AC | PRN

## 2024-04-17 NOTE — Addendum Note (Signed)
 Addended by: ALMEDA DEGREE on: 04/17/2024 01:25 PM   Modules accepted: Orders
